# Patient Record
Sex: Male | Born: 1960 | Race: Black or African American | Hispanic: No | Marital: Single | Smoking: Current every day smoker
Health system: Southern US, Community
[De-identification: ages and names within clinical notes are randomized; demographics above are authoritative.]

## PROBLEM LIST (undated history)

## (undated) DIAGNOSIS — S0300XA Dislocation of jaw, unspecified side, initial encounter: Secondary | ICD-10-CM

## (undated) DIAGNOSIS — K219 Gastro-esophageal reflux disease without esophagitis: Secondary | ICD-10-CM

## (undated) DIAGNOSIS — E119 Type 2 diabetes mellitus without complications: Secondary | ICD-10-CM

## (undated) DIAGNOSIS — J449 Chronic obstructive pulmonary disease, unspecified: Secondary | ICD-10-CM

## (undated) DIAGNOSIS — I10 Essential (primary) hypertension: Secondary | ICD-10-CM

## (undated) DIAGNOSIS — K08109 Complete loss of teeth, unspecified cause, unspecified class: Secondary | ICD-10-CM

## (undated) DIAGNOSIS — G4730 Sleep apnea, unspecified: Secondary | ICD-10-CM

## (undated) DIAGNOSIS — R7303 Prediabetes: Secondary | ICD-10-CM

## (undated) HISTORY — PX: TOOTH EXTRACTION: SHX859

## (undated) HISTORY — PX: COLONOSCOPY: SHX174

---

## 2017-07-25 DIAGNOSIS — G4730 Sleep apnea, unspecified: Secondary | ICD-10-CM | POA: Insufficient documentation

## 2017-11-29 DIAGNOSIS — N401 Enlarged prostate with lower urinary tract symptoms: Secondary | ICD-10-CM | POA: Insufficient documentation

## 2018-05-14 DIAGNOSIS — F17210 Nicotine dependence, cigarettes, uncomplicated: Secondary | ICD-10-CM | POA: Insufficient documentation

## 2018-05-14 DIAGNOSIS — I10 Essential (primary) hypertension: Secondary | ICD-10-CM | POA: Insufficient documentation

## 2018-05-14 DIAGNOSIS — Z8711 Personal history of peptic ulcer disease: Secondary | ICD-10-CM | POA: Insufficient documentation

## 2020-02-29 ENCOUNTER — Ambulatory Visit
Admission: EM | Admit: 2020-02-29 | Discharge: 2020-02-29 | Disposition: A | Discharge status: Home / Self Care | Payer: BC Managed Care – PPO | Attending: Emergency Medicine | Admitting: Emergency Medicine

## 2020-02-29 ENCOUNTER — Encounter: Payer: Self-pay | Admitting: Gynecology

## 2020-02-29 ENCOUNTER — Other Ambulatory Visit: Payer: Self-pay

## 2020-02-29 DIAGNOSIS — I10 Essential (primary) hypertension: Secondary | ICD-10-CM

## 2020-02-29 DIAGNOSIS — Z760 Encounter for issue of repeat prescription: Secondary | ICD-10-CM | POA: Diagnosis not present

## 2020-02-29 HISTORY — DX: Prediabetes: R73.03

## 2020-02-29 HISTORY — DX: Essential (primary) hypertension: I10

## 2020-02-29 MED ORDER — AMLODIPINE BESY-BENAZEPRIL HCL 10-40 MG PO CAPS: 1.0000 | ORAL_CAPSULE | Freq: Every day | ORAL | 0 refills | Status: DC

## 2020-02-29 NOTE — ED Triage Notes (Signed)
Per patient recently relocated from [place] and has not found a primary care doctor as yet. Patient stated would like a refill on his RX Amlodipine-Benaz 10/40 mg for his blood pressure. Pt stated has not taken over 1 week.

## 2020-02-29 NOTE — ED Provider Notes (Signed)
HPI  SUBJECTIVE:  Cory Thompson is a 59 y.o. male who presents for medication refill. States that he ran out of his blood pressure medication 7 8 days ago. He has not measured his blood pressure recently at home. States that he misplaced his blood pressure cuff. He denies any symptoms. No headache, visual changes, arm or leg weakness, slurred speech, facial droop, syncope, seizures, chest pain, shortness of breath, palpitations, abdominal pain, anuria, hematuria, lower extremity edema, cocaine, decongestant use. He states that his blood pressure medicine controls his blood pressure well. He has a past medical history of hypertension and bradycardia diabetes. He does not need medication for his prediabetes. No history of MI, stroke, chronic kidney disease. PMD: None. States that he moved here 4 months ago.   Past Medical History:  Diagnosis Date  . Hypertension   . Pre-diabetes     Past Surgical History:  Procedure Laterality Date  . COLONOSCOPY    . TOOTH EXTRACTION      Family History  Problem Relation Age of Onset  . Diabetes Mother   . Hypertension Mother   . Hypertension Father     Social History   Tobacco Use  . Smoking status: Current Every Day Smoker    Packs/day: 1.00    Types: Cigarettes  . Smokeless tobacco: Never Used  Vaping Use  . Vaping Use: Never assessed  Substance Use Topics  . Alcohol use: Yes    Comment: occasion  . Drug use: Never    No current facility-administered medications for this encounter.  Current Outpatient Medications:  .  aspirin 81 MG EC tablet, Take by mouth., Disp: , Rfl:  .  atorvastatin (LIPITOR) 40 MG tablet, Take by mouth., Disp: , Rfl:  .  metFORMIN (GLUCOPHAGE) 500 MG tablet, Take by mouth., Disp: , Rfl:  .  tamsulosin (FLOMAX) 0.4 MG CAPS capsule, Take by mouth., Disp: , Rfl:  .  amLODipine-benazepril (LOTREL) 10-40 MG capsule, Take 1 capsule by mouth daily., Disp: 90 capsule, Rfl: 0  No Known Allergies   ROS  As noted  in HPI.   Physical Exam  BP (!) 163/94 (BP Location: Left Arm)   Pulse 71   Temp 98 F (36.7 C) (Oral)   Resp 16   Ht 5\' 11"  (1.803 m)   Wt 99.8 kg   SpO2 98%   BMI 30.68 kg/m   Constitutional: Well developed, well nourished, no acute distress Eyes:  EOMI, conjunctiva normal bilaterally HENT: Normocephalic, atraumatic,mucus membranes moist Respiratory: Normal inspiratory effort lungs clear bilaterally Cardiovascular: Normal rate, regular rhythm no murmurs, rubs, gallops GI: nondistended skin: No rash, skin intact Musculoskeletal: no deformities, calves symmetric, nontender, no edema Neurologic: Alert & oriented x 3, no focal neuro deficits Psychiatric: Speech and behavior appropriate   ED Course   Medications - No data to display  No orders of the defined types were placed in this encounter.   No results found for this or any previous visit (from the past 24 hour(s)). No results found.  ED Clinical Impression  1. Essential hypertension   2. Medication refill      ED Assessment/Plan  Hypertension/medication refill.  Blood pressure noted.  Patient is asymptomatic as noted in HPI.  Has not taken his blood pressure medicine in a little over a week.  Will refill the amlodipine/benazepril and give him 3 months worth.  Will provide primary care list for follow-up.    States that he does not need any Metformin.  Discussed  MDM,  treatment plan, and plan for follow-up with patient. patient agrees with plan.   Meds ordered this encounter  Medications  . amLODipine-benazepril (LOTREL) 10-40 MG capsule    Sig: Take 1 capsule by mouth daily.    Dispense:  90 capsule    Refill:  0    *This clinic note was created using Scientist, clinical (histocompatibility and immunogenetics). Therefore, there may be occasional mistakes despite careful proofreading.   ?    Domenick Gong, MD 03/02/20 [phone]

## 2020-02-29 NOTE — Discharge Instructions (Addendum)
Decrease your salt intake. diet and exercise will lower your blood pressure significantly. It is important to keep your blood pressure under good control, as having a elevated blood pressure for prolonged periods of time significantly increases your risk of stroke, heart attacks, kidney damage, eye damage, and other problems. Measure your blood pressure once a day, preferably at the same time every day. Keep a log of this and bring it to your next doctor's appointment.  Bring your blood pressure cuff as well.  Return here in 2 weeks for blood pressure recheck if you're unable to find a primary care physician by then. Return immediately to the ER if you start having chest pain, headache, problems seeing, problems talking, problems walking, if you feel like you're about to pass out, if you do pass out, if you have a seizure, or for any other concerns.  Here is a list of primary care providers who are taking new patients:  Dr. Elizabeth Sauer [address] [place] [address] [phone]  [facility] [address] [place], [place] [address] [phone]  [facility] [place] [address] [place] [address]  [phone]  [facility] [address] [place], [place] [address] [phone]  [facility] [address]  [phone] [place], [place] [address]   Go to www.goodrx.com to look up your medications. This will give you a list of where you can find your prescriptions at the most affordable prices. Or ask the pharmacist what the cash price is, or if they have any other discount programs available to help make your medication more affordable. This can be less expensive than what you would pay with insurance.

## 2020-06-06 ENCOUNTER — Encounter: Payer: Self-pay | Admitting: Emergency Medicine

## 2020-06-06 ENCOUNTER — Ambulatory Visit
Admission: EM | Admit: 2020-06-06 | Discharge: 2020-06-06 | Disposition: A | Discharge status: Home / Self Care | Payer: BC Managed Care – PPO | Attending: Emergency Medicine | Admitting: Emergency Medicine

## 2020-06-06 ENCOUNTER — Other Ambulatory Visit: Payer: Self-pay

## 2020-06-06 DIAGNOSIS — I10 Essential (primary) hypertension: Secondary | ICD-10-CM | POA: Insufficient documentation

## 2020-06-06 DIAGNOSIS — Z760 Encounter for issue of repeat prescription: Secondary | ICD-10-CM | POA: Insufficient documentation

## 2020-06-06 DIAGNOSIS — E119 Type 2 diabetes mellitus without complications: Secondary | ICD-10-CM | POA: Insufficient documentation

## 2020-06-06 HISTORY — DX: Type 2 diabetes mellitus without complications: E11.9

## 2020-06-06 LAB — GLUCOSE, CAPILLARY: Glucose-Capillary: 128 mg/dL — ABNORMAL HIGH (ref 70–99)

## 2020-06-06 MED ORDER — FREESTYLE SYSTEM KIT
1.0000 | PACK | Freq: Every day | 0 refills | Status: AC
Start: 2020-06-06 — End: ?

## 2020-06-06 MED ORDER — GLUCOSE BLOOD VI STRP: ORAL_STRIP | 2 refills | Status: AC

## 2020-06-06 MED ORDER — AMLODIPINE BESY-BENAZEPRIL HCL 10-40 MG PO CAPS: 1.0000 | ORAL_CAPSULE | Freq: Every day | ORAL | 0 refills | Status: DC

## 2020-06-06 NOTE — Discharge Instructions (Addendum)
500 mg of Metformin in the morning and 500 mg at night.  Keep a log of your blood sugar.  Measure your blood sugar before breakfast and 1 hour after meal at night.  Keep a log of this.  Bring the log with you to your primary care provider appointment on December 4. Continue keeping a log of your blood pressure.  Bring this with you to your appointment as well.  Decrease your salt intake. diet and exercise will lower your blood pressure significantly. It is important to keep your blood pressure under good control, as having a elevated blood pressure for prolonged periods of time significantly increases your risk of stroke, heart attacks, kidney damage, eye damage, and other problems. Measure your blood pressure once a day, preferably at the same time every day. Keep a log of this and bring it to your next doctor's appointment.  Bring your blood pressure cuff as well.  Return here in 2 weeks for blood pressure recheck if you're unable to find a primary care physician by then. Return immediately to the ER if you start having chest pain, headache, problems seeing, problems talking, problems walking, if you feel like you're about to pass out, if you do pass out, if you have a seizure, or for any other concerns.  Go to www.goodrx.com to look up your medications. This will give you a list of where you can find your prescriptions at the most affordable prices. Or ask the pharmacist what the cash price is, or if they have any other discount programs available to help make your medication more affordable. This can be less expensive than what you would pay with insurance.

## 2020-06-06 NOTE — ED Triage Notes (Signed)
Pt present to [facility] for a refill on his BP medication. He states he has an appt with his PCP on December 4th but he will run out before then.

## 2020-06-06 NOTE — ED Provider Notes (Signed)
HPI  SUBJECTIVE:  Cory Thompson is a 59 y.o. male who presents for a medication refill of his blood pressure medications.  He states that the amlodipine/benazepril 10/40 has been controlling his blood pressure well.  He has been measuring at home and it has been ranging in the 120s over 70s.  He states that he is about to run out of his medication and needs a refill.  He is establishing care with [place] [facility] on December 4.  States that he is taking his blood pressure medication daily, denies any side effects.  He has no complaints.  He is also requesting that we check his glucose.  He is prescribed Metformin 1000 mg twice daily, but has only been taking at 1000 mg once a day.  He denies unintentional weight loss, polyuria, polydipsia, weakness, fatigue.  He has refills of this already, and does not need a prescription.  He has a past medical history of hypertension, obstructive sleep apnea, diabetes.  No history of asthma, edema, COPD.  PMD: He is establishing care with [place].    Past Medical History:  Diagnosis Date  . Diabetes mellitus without complication ([place])   . Hypertension   . Pre-diabetes     Past Surgical History:  Procedure Laterality Date  . COLONOSCOPY    . TOOTH EXTRACTION      Family History  Problem Relation Age of Onset  . Diabetes Mother   . Hypertension Mother   . Hypertension Father     Social History   Tobacco Use  . Smoking status: Current Every Day Smoker    Packs/day: 1.00    Types: Cigarettes  . Smokeless tobacco: Never Used  Vaping Use  . Vaping Use: Never assessed  Substance Use Topics  . Alcohol use: Yes    Comment: occasion  . Drug use: Never    No current facility-administered medications for this encounter.  Current Outpatient Medications:  .  metFORMIN (GLUCOPHAGE) 500 MG tablet, Take by mouth., Disp: , Rfl:  .  amLODipine-benazepril (LOTREL) 10-40 MG capsule, Take 1 capsule by mouth daily., Disp: 30 capsule, Rfl:  0 .  aspirin 81 MG EC tablet, Take by mouth., Disp: , Rfl:  .  atorvastatin (LIPITOR) 40 MG tablet, Take by mouth., Disp: , Rfl:  .  glucose blood test strip, Check your sugar in the morning before you eat breakfast, and one hour after a meal., Disp: 100 each, Rfl: 2 .  glucose monitoring kit (FREESTYLE) monitoring kit, 1 each by Does not apply route daily. Check glucose once in the morning before breakfast and 1 hour after a meal, Disp: 1 each, Rfl: 0  No Known Allergies   ROS  As noted in HPI.   Physical Exam  BP 127/87 (BP Location: Right Arm)   Pulse 88   Temp 98 F (36.7 C) (Oral)   Resp 18   Ht 5' 11"  (1.803 m)   Wt 99.8 kg   SpO2 97%   BMI 30.69 kg/m   Constitutional: Well developed, well nourished, no acute distress Eyes:  EOMI, conjunctiva normal bilaterally HENT: Normocephalic, atraumatic,mucus membranes moist Respiratory: Normal inspiratory effort, lungs clear bilaterally. Cardiovascular: Normal rate regular rhythm no murmurs rubs or gallops GI: nondistended skin: No rash, skin intact Musculoskeletal: no deformities Neurologic: Alert & oriented x 3, no focal neuro deficits Psychiatric: Speech and behavior appropriate   ED Course   Medications - No data to display  Orders Placed This Encounter  Procedures  .  Glucose, capillary    Standing Status:   Standing    Number of Occurrences:   1  . CBG monitoring, ED    Standing Status:   Standing    Number of Occurrences:   1    Results for orders placed or performed during the hospital encounter of 06/06/20 (from the past 24 hour(s))  Glucose, capillary     Status: Abnormal   Collection Time: 06/06/20  8:59 AM  Result Value Ref Range   Glucose-Capillary 128 (H) 70 - 99 mg/dL   No results found.  ED Clinical Impression  1. Essential hypertension   2. Type 2 diabetes mellitus without complication, without long-term current use of insulin ([place])   3. Medication refill      ED Assessment/Plan   1.   Hypertension.  Patient doing well on this.  No side effects.  Blood pressure is within normal limits here and states that it has been running normal limits at home.  Will provide 1 month refill of amlodipine/benazepril 10/40 1 tablet daily.  2.  Diabetes.  Previous records reviewed.  Last A1c in 11/20 was 7.  Was on 1000 mg of Metformin twice a day, but he has been taking metformin 1000 mg once a day.  His fingerstick here is 128.  Will have him do 500 mg of Metformin in the morning, 500 mg of Metformin at night for now, have him keep a log of his blood sugar, prescribe glucometer with strips.  States he does not need a refill on his Metformin.  Patient is establishing primary care with [place] [facility] on December 4.  Discussed labs, MDM, treatment plan, and plan for follow-up with patient.  patient agrees with plan.   Meds ordered this encounter  Medications  . amLODipine-benazepril (LOTREL) 10-40 MG capsule    Sig: Take 1 capsule by mouth daily.    Dispense:  30 capsule    Refill:  0  . glucose monitoring kit (FREESTYLE) monitoring kit    Sig: 1 each by Does not apply route daily. Check glucose once in the morning before breakfast and 1 hour after a meal    Dispense:  1 each    Refill:  0  . glucose blood test strip    Sig: Check your sugar in the morning before you eat breakfast, and one hour after a meal.    Dispense:  100 each    Refill:  2    *This clinic note was created using Lobbyist. Therefore, there may be occasional mistakes despite careful proofreading.   ?    Melynda Ripple, MD 06/06/20 1006

## 2020-07-10 ENCOUNTER — Telehealth: Payer: Self-pay | Admitting: Emergency Medicine

## 2020-07-10 ENCOUNTER — Ambulatory Visit
Admission: EM | Admit: 2020-07-10 | Discharge: 2020-07-10 | Disposition: A | Discharge status: Home / Self Care | Payer: BC Managed Care – PPO | Attending: Sports Medicine | Admitting: Sports Medicine

## 2020-07-10 ENCOUNTER — Other Ambulatory Visit: Payer: Self-pay

## 2020-07-10 DIAGNOSIS — E1165 Type 2 diabetes mellitus with hyperglycemia: Secondary | ICD-10-CM | POA: Diagnosis not present

## 2020-07-10 DIAGNOSIS — Z760 Encounter for issue of repeat prescription: Secondary | ICD-10-CM

## 2020-07-10 DIAGNOSIS — I10 Essential (primary) hypertension: Secondary | ICD-10-CM | POA: Diagnosis not present

## 2020-07-10 LAB — COMPREHENSIVE METABOLIC PANEL
ALT: 24 U/L (ref 0–44)
AST: 54 U/L — ABNORMAL HIGH (ref 15–41)
Albumin: 4.3 g/dL (ref 3.5–5.0)
Alkaline Phosphatase: 94 U/L (ref 38–126)
Anion gap: 8 (ref 5–15)
BUN: 11 mg/dL (ref 6–20)
CO2: 25 mmol/L (ref 22–32)
Calcium: 9.4 mg/dL (ref 8.9–10.3)
Chloride: 100 mmol/L (ref 98–111)
Creatinine, Ser: 1.17 mg/dL (ref 0.61–1.24)
GFR, Estimated: >60 mL/min (ref >60)
Glucose, Bld: 126 mg/dL — ABNORMAL HIGH (ref 70–99)
Potassium: 4.2 mmol/L (ref 3.5–5.1)
Sodium: 133 mmol/L — ABNORMAL LOW (ref 135–145)
Total Bilirubin: 1 mg/dL (ref 0.3–1.2)
Total Protein: 8.4 g/dL — ABNORMAL HIGH (ref 6.5–8.1)

## 2020-07-10 LAB — GLUCOSE, CAPILLARY: Glucose-Capillary: 132 mg/dL — ABNORMAL HIGH (ref 70–99)

## 2020-07-10 MED ORDER — AMLODIPINE BESY-BENAZEPRIL HCL 10-40 MG PO CAPS: ORAL_CAPSULE | ORAL | 1 refills | Status: DC

## 2020-07-10 NOTE — ED Provider Notes (Signed)
[facility]    CSN: [id number] Arrival date & time: 07/10/20  1419      History   Chief Complaint Chief Complaint  Patient presents with  . Medication Refill    HPI Cory Thompson is a 59 y.o. male.   Cory Thompson is a 59 y.o. male who presents for medication refill. States has been seen here several times for medication refills for his hypertension. He has not measured his blood pressure recently at home. States that he misplaced his blood pressure cuff. He denies any symptoms. No headache, visual changes, arm or leg weakness, slurred speech, facial droop, syncope, seizures, chest pain, shortness of breath, palpitations, abdominal pain, anuria, hematuria, lower extremity edema, cocaine, decongestant use. He states that his blood pressure medicine controls his blood pressure well. He has a past medical history of hypertension and bradycardia and diabetes. He does not need medication for his prediabetes and has been obtaining these medications from a physician in [place] where he relocated from 8 months ago.  She he has not seen that doctor since he left [place]. No history of MI, stroke, chronic kidney disease. PMD: None. States that he moved here 8 months ago.  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  He reports that he has an appointment to establish care on August 17, 2020.  Of note, he has not had any labs drawn in over a year for his hypertension or his diabetes.     Past Medical History:  Diagnosis Date  . Diabetes mellitus without complication ([place])   . Hypertension   . Pre-diabetes     There are no problems to display for this patient.   Past Surgical History:  Procedure Laterality Date  . COLONOSCOPY    . TOOTH EXTRACTION         Home Medications    Prior to Admission medications   Medication Sig Start Date End Date Taking? Authorizing Provider  esomeprazole (NEXIUM) 40 MG capsule  TAKE 1 CAPSULE BY MOUTH EVERY MORNING BEFORE BREAKFAST 07/08/19  Yes [provider]  fluticasone (FLONASE) 50 MCG/ACT nasal spray Place into the nose. 05/14/18  Yes [provider]  amLODipine-benazepril (LOTREL) 10-40 MG capsule 1 tab po daily 07/10/20 08/10/20  Verda Cumins, MD  aspirin 81 MG EC tablet Take by mouth. 06/17/19   [provider]  atorvastatin (LIPITOR) 40 MG tablet Take by mouth. 06/17/19   [provider]  glucose blood test strip Check your sugar in the morning before you eat breakfast, and one hour after a meal. 06/06/20   Melynda Ripple, MD  glucose monitoring kit (FREESTYLE) monitoring kit 1 each by Does not apply route daily. Check glucose once in the morning before breakfast and 1 hour after a meal 06/06/20   Melynda Ripple, MD  metFORMIN (GLUCOPHAGE) 500 MG tablet Take by mouth. 08/02/19   [provider]    Family History Family History  Problem Relation Age of Onset  . Diabetes Mother   . Hypertension Mother   . Hypertension Father     Social History Social History   Tobacco Use  . Smoking status: Current Every Day Smoker    Packs/day: 1.00    Types: Cigarettes  . Smokeless tobacco: Never Used  Substance Use Topics  . Alcohol use: Yes    Comment: occasion  . Drug use: Never     Allergies   Patient has no  known allergies.   Review of Systems Review of Systems  Constitutional: Negative for activity change and appetite change.  Respiratory: Negative for apnea, cough and chest tightness.   Cardiovascular: Negative for chest pain.  Gastrointestinal: Negative for abdominal pain, nausea and vomiting.  Skin: Negative for color change, pallor, rash and wound.  Neurological: Negative for dizziness, tremors, seizures, syncope, speech difficulty, weakness and headaches.  All other systems reviewed and are negative.    Physical Exam Triage Vital Signs ED Triage Vitals  Enc Vitals Group     BP  07/10/20 1435 133/78     Pulse Rate 07/10/20 1435 93     Resp 07/10/20 1435 19     Temp 07/10/20 1435 98.6 F (37 C)     Temp Source 07/10/20 1435 Oral     SpO2 07/10/20 1435 97 %     Weight --      Height --      Head Circumference --      Peak Flow --      Pain Score 07/10/20 1433 0     Pain Loc --      Pain Edu? --      Excl. in [place]? --    No data found.  Updated Vital Signs BP 133/78 (BP Location: Left Arm)   Pulse 93   Temp 98.6 F (37 C) (Oral)   Resp 19   SpO2 97%   Visual Acuity Right Eye Distance:   Left Eye Distance:   Bilateral Distance:    Right Eye Near:   Left Eye Near:    Bilateral Near:     Physical Exam Vitals and nursing note reviewed.  Constitutional:      General: He is not in acute distress.    Appearance: Normal appearance. He is not ill-appearing or toxic-appearing.  HENT:     Head: Normocephalic and atraumatic.     Mouth/Throat:     Mouth: Mucous membranes are moist.     Pharynx: Oropharynx is clear.  Eyes:     Extraocular Movements: Extraocular movements intact.     Pupils: Pupils are equal, round, and reactive to light.  Cardiovascular:     Rate and Rhythm: Normal rate and regular rhythm.     Pulses: Normal pulses.     Heart sounds: Normal heart sounds. No murmur heard. No gallop.   Pulmonary:     Effort: Pulmonary effort is normal.     Breath sounds: Normal breath sounds.  Abdominal:     General: Bowel sounds are normal. There is no distension.     Palpations: Abdomen is soft.     Tenderness: There is no abdominal tenderness. There is no right CVA tenderness, left CVA tenderness, guarding or rebound.     Hernia: A hernia is present.     Comments: Abdomen is soft, obese, nontender.  Patient does have a midline hernia that is accentuated with sitting forward.  No distention, no rebound, no guarding.  No CVA tenderness.  Bowel sounds are normal.  Musculoskeletal:     Cervical back: Normal range of motion and neck supple.  Skin:     Capillary Refill: Capillary refill takes less than 2 seconds.  Neurological:     General: No focal deficit present.     Mental Status: He is alert and oriented to person, place, and time.      UC Treatments / Results  Labs (all labs ordered are listed, but only abnormal results are displayed) Labs Reviewed  COMPREHENSIVE METABOLIC PANEL - Abnormal; Notable for the following components:      Result Value   Sodium 133 (*)    Glucose, Bld 126 (*)    Total Protein 8.4 (*)    AST 54 (*)    All other components within normal limits  GLUCOSE, CAPILLARY - Abnormal; Notable for the following components:   Glucose-Capillary 132 (*)    All other components within normal limits  HEMOGLOBIN A1C  PROTEIN / CREATININE RATIO, URINE  CBG MONITORING, ED    EKG   Radiology No results found.  Procedures Procedures (including critical care time)  Medications Ordered in UC Medications - No data to display  Initial Impression / Assessment and Plan / UC Course  I have reviewed the triage vital signs and the nursing notes.  Pertinent labs & imaging results that were available during my care of the patient were reviewed by me and considered in my medical decision making (see chart for details).  Clinical Course as of 07/10/20 1914  Ludwig Clarks Jul 10, 2020  1616 CBG monitoring, ED [KB]    Clinical Course User Index [KB] Verda Cumins, MD    Findings MMM discussed detail with the patient.  Patient voiced verbal understanding was in agreement.  I went ahead and check labs.  Reviewed the labs that were back.  Her sodium was a little bit low as well as his AST being a little elevated.  His liver function may be due to fatty liver or alcohol use.  We did discuss keeping his alcohol content to a minimum.  Encouraged him to drink plenty of fluids and to supplement his diet with a little bit of salt.  Discussed the balance given the fact that he does have hypertension.  Creatinine was within normal  limits so I went ahead and renewed his blood pressure medication.  He has a follow-up with Dr. Ronnald Ramp to establish care for his diabetes and hypertension.  Encouraged him to go ahead and keep that appointment which is scheduled for January 24.  I did give him a refill on his blood pressure medicines which should take him through to the scheduled follow-up.  Return to the clinic if he has any problems.  This was discussed in detail. Final Clinical Impressions(s) / UC Diagnoses   Final diagnoses:  Primary hypertension  Medication refill  Type 2 diabetes mellitus with hyperglycemia, without long-term current use of insulin [facility])     Discharge Instructions     He has a follow-up with Dr. Ronnald Ramp to establish care for his diabetes and hypertension.  Encouraged him to go ahead and keep that appointment which is scheduled for January 24.  I did give him a refill on his blood pressure medicines which should take him through to the scheduled follow-up.  Return to the clinic if he has any problems.  This was discussed in detail.    ED Prescriptions    Medication Sig Dispense Auth. Provider   amLODipine-benazepril (LOTREL) 10-40 MG capsule 1 tab po daily 30 capsule Verda Cumins, MD     PDMP not reviewed this encounter.   Verda Cumins, MD 07/10/20 [phone]

## 2020-07-10 NOTE — Discharge Instructions (Addendum)
He has a follow-up with Dr. Yetta Barre to establish care for his diabetes and hypertension.  Encouraged him to go ahead and keep that appointment which is scheduled for January 24.  I did give him a refill on his blood pressure medicines which should take him through to the scheduled follow-up.  Return to the clinic if he has any problems.  This was discussed in detail.

## 2020-07-10 NOTE — ED Triage Notes (Signed)
Pt is here needing Amlopdipine-benazepril refilled, pt states he keeps missing his PCP appointments & so he just comes here to get Rx refills. Pt last had his BP med yesterday, he has none left.

## 2020-07-11 LAB — PROTEIN / CREATININE RATIO, URINE
Creatinine, Urine: 185 mg/dL
Protein Creatinine Ratio: 0.11 mg/mg{Cre} (ref 0.00–0.15)
Total Protein, Urine: 20 mg/dL

## 2020-07-11 LAB — HEMOGLOBIN A1C
Hgb A1c MFr Bld: 7.1 % — ABNORMAL HIGH (ref 4.8–5.6)
Mean Plasma Glucose: 157.07 mg/dL

## 2020-08-17 ENCOUNTER — Ambulatory Visit: Payer: BC Managed Care – PPO | Admitting: Family Medicine

## 2020-08-17 ENCOUNTER — Other Ambulatory Visit: Payer: Self-pay

## 2020-08-17 ENCOUNTER — Encounter: Payer: Self-pay | Admitting: Family Medicine

## 2020-08-17 VITALS — BP 148/84 | HR 84 | Ht 71.0 in | Wt 229.0 lb

## 2020-08-17 DIAGNOSIS — E782 Mixed hyperlipidemia: Secondary | ICD-10-CM | POA: Diagnosis not present

## 2020-08-17 DIAGNOSIS — G4733 Obstructive sleep apnea (adult) (pediatric): Secondary | ICD-10-CM

## 2020-08-17 DIAGNOSIS — R3911 Hesitancy of micturition: Secondary | ICD-10-CM

## 2020-08-17 DIAGNOSIS — J449 Chronic obstructive pulmonary disease, unspecified: Secondary | ICD-10-CM

## 2020-08-17 DIAGNOSIS — I10 Essential (primary) hypertension: Secondary | ICD-10-CM

## 2020-08-17 DIAGNOSIS — E119 Type 2 diabetes mellitus without complications: Secondary | ICD-10-CM

## 2020-08-17 DIAGNOSIS — R7401 Elevation of levels of liver transaminase levels: Secondary | ICD-10-CM

## 2020-08-17 DIAGNOSIS — Z7689 Persons encountering health services in other specified circumstances: Secondary | ICD-10-CM

## 2020-08-17 DIAGNOSIS — Z720 Tobacco use: Secondary | ICD-10-CM

## 2020-08-17 DIAGNOSIS — H9312 Tinnitus, left ear: Secondary | ICD-10-CM

## 2020-08-17 MED ORDER — AMLODIPINE BESY-BENAZEPRIL HCL 10-40 MG PO CAPS: ORAL_CAPSULE | ORAL | 0 refills | Status: DC

## 2020-08-17 MED ORDER — METFORMIN HCL 1000 MG PO TABS: 1000.0000 mg | ORAL_TABLET | Freq: Two times a day (BID) | ORAL | 0 refills | Status: DC

## 2020-08-17 MED ORDER — ATORVASTATIN CALCIUM 40 MG PO TABS: 40.0000 mg | ORAL_TABLET | Freq: Every day | ORAL | 0 refills | Status: DC

## 2020-08-17 NOTE — Patient Instructions (Signed)

## 2020-08-17 NOTE — Progress Notes (Signed)
Date:  08/17/2020   Name:  Cory Thompson   DOB:  [birth year]   MRN:  [id number]   Chief Complaint: Establish Care, Diabetes, and Flu Vaccine  Patient is a 60 year old male who presents for a establish care exam. The patient reports the following problems: DM/HTN/OSA/hyperlipidemaia/ bph. Health maintenance has been reviewed up to date.  Diabetes He presents for his follow-up diabetic visit. He has type 2 diabetes mellitus. His disease course has been stable. There are no hypoglycemic associated symptoms. Pertinent negatives for hypoglycemia include no dizziness, headaches or nervousness/anxiousness. There are no diabetic associated symptoms. Pertinent negatives for diabetes include no chest pain and no polydipsia. There are no hypoglycemic complications. There are no diabetic complications. Risk factors for coronary artery disease include dyslipidemia, diabetes mellitus, hypertension, tobacco exposure and obesity. Current diabetic treatment includes oral agent (monotherapy). He is following a generally unhealthy diet. Meal planning includes avoidance of concentrated sweets and carbohydrate counting. An ACE inhibitor/angiotensin II receptor blocker is being taken.  Hypertension This is a chronic problem. The current episode started more than 1 year ago. The problem is unchanged. The problem is uncontrolled. Pertinent negatives include no chest pain, headaches, neck pain, palpitations or shortness of breath.    Lab Results  Component Value Date   CREATININE 1.17 07/10/2020   BUN 11 07/10/2020   NA 133 (L) 07/10/2020   K 4.2 07/10/2020   CL 100 07/10/2020   CO2 25 07/10/2020   No results found for: CHOL, HDL, LDLCALC, LDLDIRECT, TRIG, CHOLHDL No results found for: TSH Lab Results  Component Value Date   HGBA1C 7.1 (H) 07/10/2020   No results found for: WBC, HGB, HCT, MCV, PLT Lab Results  Component Value Date   ALT 24 07/10/2020   AST 54 (H) 07/10/2020   ALKPHOS 94 07/10/2020    BILITOT 1.0 07/10/2020     Review of Systems  Constitutional: Negative for chills and fever.  HENT: Negative for drooling, ear discharge, ear pain and sore throat.   Respiratory: Negative for cough, shortness of breath and wheezing.   Cardiovascular: Negative for chest pain, palpitations and leg swelling.  Gastrointestinal: Negative for abdominal pain, blood in stool, constipation, diarrhea and nausea.  Endocrine: Negative for polydipsia.  Genitourinary: Negative for dysuria, frequency, hematuria and urgency.  Musculoskeletal: Negative for back pain, myalgias and neck pain.  Skin: Negative for rash.  Allergic/Immunologic: Negative for environmental allergies.  Neurological: Negative for dizziness and headaches.  Hematological: Does not bruise/bleed easily.  Psychiatric/Behavioral: Negative for suicidal ideas. The patient is not nervous/anxious.     There are no problems to display for this patient.   No Known Allergies  Past Surgical History:  Procedure Laterality Date  . COLONOSCOPY    . TOOTH EXTRACTION      Social History   Tobacco Use  . Smoking status: Current Every Day Smoker    Packs/day: 1.00    Types: Cigarettes  . Smokeless tobacco: Never Used  Substance Use Topics  . Alcohol use: Yes    Comment: occasion  . Drug use: Never     Medication list has been reviewed and updated.  Current Meds  Medication Sig  . amLODipine-benazepril (LOTREL) 10-40 MG capsule 1 tab po daily  . fluticasone (FLONASE) 50 MCG/ACT nasal spray Place into the nose.  [facility] Kitchen glucose blood test strip Check your sugar in the morning before you eat breakfast, and one hour after a meal.  . glucose monitoring kit (FREESTYLE) monitoring kit 1  each by Does not apply route daily. Check glucose once in the morning before breakfast and 1 hour after a meal  . metFORMIN (GLUCOPHAGE) 500 MG tablet Take 500 mg by mouth 2 (two) times daily with a meal.    PHQ 2/9 Scores 08/17/2020  PHQ - 2 Score 0   PHQ- 9 Score 2    GAD 7 : Generalized Anxiety Score 08/17/2020  Nervous, Anxious, on Edge 0  Control/stop worrying 0  Worry too much - different things 0  Trouble relaxing 0  Restless 0  Easily annoyed or irritable 0  Afraid - awful might happen 0  Total GAD 7 Score 0    BP Readings from Last 3 Encounters:  08/17/20 (!) 148/84  07/10/20 133/78  06/06/20 127/87    Physical Exam Vitals and nursing note reviewed.  Constitutional:      Appearance: He is well-developed and well-nourished. He is obese.  HENT:     Head: Normocephalic.     Right Ear: Tympanic membrane, ear canal and external ear normal.     Left Ear: Tympanic membrane, ear canal and external ear normal.     Nose: Nose normal.     Mouth/Throat:     Lips: Pink.     Mouth: Oropharynx is clear and moist. Mucous membranes are moist.  Eyes:     Extraocular Movements: EOM normal.     Conjunctiva/sclera: Conjunctivae normal.     Pupils: Pupils are equal, round, and reactive to light.  Neck:     Thyroid: No thyroid mass.  Cardiovascular:     Rate and Rhythm: Normal rate and regular rhythm.     Pulses: Normal pulses and intact distal pulses.     Heart sounds: Normal heart sounds, S1 normal and S2 normal. No murmur heard.  No systolic murmur is present.  No diastolic murmur is present. No gallop. No S3 or S4 sounds.   Pulmonary:     Effort: Pulmonary effort is normal.     Breath sounds: Normal breath sounds.  Abdominal:     General: Bowel sounds are normal.     Palpations: Abdomen is soft. There is no hepatomegaly or splenomegaly.  Genitourinary:    Penis: Normal.      Prostate: Normal.     Rectum: Normal.  Musculoskeletal:        General: Normal range of motion.     Cervical back: Full passive range of motion without pain, normal range of motion and neck supple.  Lymphadenopathy:     Cervical: No cervical adenopathy.  Skin:    General: Skin is warm and dry.  Neurological:     Mental Status: He is alert  and oriented to person, place, and time.     Deep Tendon Reflexes: Reflexes are normal and symmetric.  Psychiatric:        Mood and Affect: Mood and affect normal.        Behavior: Behavior normal.        Thought Content: Thought content normal.        Judgment: Judgment normal.     Wt Readings from Last 3 Encounters:  08/17/20 229 lb (103.9 kg)  06/06/20 220 lb 0.3 oz (99.8 kg)  02/29/20 220 lb (99.8 kg)    BP (!) 148/84   Pulse 84   Ht _0  (1.803 m)   Wt 229 lb (103.9 kg)   SpO2 96%   BMI 31.94 kg/m   Assessment and Plan:  1. Establishing care with  new doctor, encounter for Patient establishing care with new physician.  Patient has been going to urgent care for the last several months to refill his baseline meds which includes the following below.  2. Type 2 diabetes mellitus without complication, without long-term current use of insulin (HCC) Chronic.  Controlled.  Stable.  Patient was supposed to be taken a gram of Metformin twice a day however is only taking 500 mg this is been changed to 1 g twice a day and that he has had still uncontrolled A1c's on 500 mg in the past. - metFORMIN (GLUCOPHAGE) 1000 MG tablet; Take 1 tablet (1,000 mg total) by mouth 2 (two) times daily with a meal.  Dispense: 180 tablet; Refill: 0  3. Primary hypertension Chronic.  Controlled.  Stable.  Blood pressure today is 148/84.  We will refill his amlodipine benazepril 10-40 mg once a day.  And will recheck blood pressure in 6 weeks.  At which time we will do renal function panel. - amLODipine-benazepril (LOTREL) 10-40 MG capsule; 1 tab po daily  Dispense: 90 capsule; Refill: 0  4. Moderate mixed hyperlipidemia not requiring statin therapy Chronic.  Controlled.  Stable.  Patient has been on atorvastatin in the past 40 mg but has not been taking any most recently.  Given the he is diabetic is most likely that he needs to be on this and this has been encouraged and he is agreed to resume this at  which time we will see him in 6 weeks and recheck his lipid status. - atorvastatin (LIPITOR) 40 MG tablet; Take 1 tablet (40 mg total) by mouth daily.  Dispense: 90 tablet; Refill: 0  5. OSA (obstructive sleep apnea) Chronic.  Patient was diagnosed with severe obstructive sleep apnea but was supposed to have come back for what I gather might be a titration which he did not.  This was 3 years ago and will likely need another evaluation from the one that he had done in [place].  I am not certain if we can get the [facility] results of we will refer to ear nose and throat for referral to Dr. Farrel Conners for evaluation who he is seen in the clinic for tinnitus see below. - Ambulatory referral to ENT  6. Tinnitus of left ear Patient has tinnitus primarily of the left ear that he is consistently is noting an has become very bothersome.  I am not certain about the previous evaluation if anything can be done but we will refer for OSA and recheck of the tinnitus problem since it seems to be more unilateral to him. - Ambulatory referral to ENT  7. Hesitancy Chronic.  Consistent.  Stable.  Patient's had some hesitancy and PSA last was in the 2.5 range I believe we will on the next visit check a DRE and will likely check his PSA.  8. Elevated transaminase measurement Patient has had one of his transaminases elevated and this is likely secondary to hepatic steatosis.  For this reason patient has been resumed on his statin therapy.  9. Chronic obstructive pulmonary disease, unspecified COPD type ([place]) Patient is a pack-a-day smoker for 30 years and has continued to smoke a pack a day.  This is likely going to be an issue in the future.  10. Nicotine abuse Patient has been advised of the health risks of smoking and counseled concerning cessation of tobacco products. I spent over 3 minutes for discussion and to answer questions.

## 2020-09-02 ENCOUNTER — Telehealth: Payer: Self-pay | Admitting: *Deleted

## 2020-09-02 NOTE — Telephone Encounter (Signed)
Received referral for low dose lung cancer screening CT scan. Message left at phone number listed in EMR for patient to call me back to facilitate scheduling scan.  

## 2020-09-03 ENCOUNTER — Encounter: Payer: Self-pay | Admitting: *Deleted

## 2020-09-03 ENCOUNTER — Telehealth: Payer: Self-pay | Admitting: *Deleted

## 2020-09-03 DIAGNOSIS — F17200 Nicotine dependence, unspecified, uncomplicated: Secondary | ICD-10-CM

## 2020-09-03 DIAGNOSIS — Z87891 Personal history of nicotine dependence: Secondary | ICD-10-CM

## 2020-09-03 DIAGNOSIS — Z122 Encounter for screening for malignant neoplasm of respiratory organs: Secondary | ICD-10-CM

## 2020-09-03 NOTE — Telephone Encounter (Signed)
Received referral for initial lung cancer screening scan. Contacted patient and obtained smoking history,(current, 23.25 pack year) as well as answering questions related to screening process. Patient denies signs of lung cancer such as weight loss or hemoptysis. Patient denies comorbidity that would prevent curative treatment if lung cancer were found. Patient is scheduled for shared decision making visit and CT scan on (date TBD due to patient's work schedule).

## 2020-09-04 ENCOUNTER — Telehealth: Payer: BC Managed Care – PPO | Admitting: Oncology

## 2020-09-07 ENCOUNTER — Ambulatory Visit: Admission: RE | Admit: 2020-09-07 | Payer: BC Managed Care – PPO | Source: Ambulatory Visit

## 2020-09-25 ENCOUNTER — Inpatient Hospital Stay: Payer: BC Managed Care – PPO | Attending: Oncology | Admitting: Oncology

## 2020-09-25 DIAGNOSIS — Z87891 Personal history of nicotine dependence: Secondary | ICD-10-CM

## 2020-09-25 NOTE — Progress Notes (Signed)
Virtual Visit via Video Note  I connected with Mr. Rock on 09/25/20 at  1:30 PM EST by a video enabled telemedicine application and verified that I am speaking with the correct person using two identifiers.  Location: Patient: Home Provider: Clinic    I discussed the limitations of evaluation and management by telemedicine and the availability of in person appointments. The patient expressed understanding and agreed to proceed.  I discussed the assessment and treatment plan with the patient. The patient was provided an opportunity to ask questions and all were answered. The patient agreed with the plan and demonstrated an understanding of the instructions.   The patient was advised to call back or seek an in-person evaluation if the symptoms worsen or if the condition fails to improve as anticipated.   In accordance with CMS guidelines, patient has met eligibility criteria including age, absence of signs or symptoms of lung cancer.  Social History   Tobacco Use  . Smoking status: Current Every Day Smoker    Packs/day: 0.75    Years: 31.00    Pack years: 23.25    Types: Cigarettes  . Smokeless tobacco: Never Used  Substance Use Topics  . Alcohol use: Yes    Comment: occasion  . Drug use: Never      A shared decision-making session was conducted prior to the performance of CT scan. This includes one or more decision aids, includes benefits and harms of screening, follow-up diagnostic testing, over-diagnosis, false positive rate, and total radiation exposure.   Counseling on the importance of adherence to annual lung cancer LDCT screening, impact of co-morbidities, and ability or willingness to undergo diagnosis and treatment is imperative for compliance of the program.   Counseling on the importance of continued smoking cessation for former smokers; the importance of smoking cessation for current smokers, and information about tobacco cessation interventions have been given to  patient including [place] and [employer] quit [place] programs.   Written order for lung cancer screening with LDCT has been given to the patient and any and all questions have been answered to the best of my abilities.    Yearly follow up will be coordinated by Burgess Estelle, Thoracic Navigator.  I provided 15 minutes of face-to-face video visit time during this encounter, and > 50% was spent counseling as documented under my assessment & plan.   Jacquelin Hawking, NP

## 2020-09-28 ENCOUNTER — Ambulatory Visit
Admission: RE | Admit: 2020-09-28 | Discharge: 2020-09-28 | Disposition: A | Discharge status: Home / Self Care | Payer: BC Managed Care – PPO | Source: Ambulatory Visit | Attending: Oncology | Admitting: Oncology

## 2020-09-28 ENCOUNTER — Other Ambulatory Visit: Payer: Self-pay

## 2020-09-28 DIAGNOSIS — Z122 Encounter for screening for malignant neoplasm of respiratory organs: Secondary | ICD-10-CM | POA: Diagnosis not present

## 2020-09-28 DIAGNOSIS — F17200 Nicotine dependence, unspecified, uncomplicated: Secondary | ICD-10-CM

## 2020-09-28 DIAGNOSIS — Z87891 Personal history of nicotine dependence: Secondary | ICD-10-CM

## 2020-09-28 DIAGNOSIS — F17210 Nicotine dependence, cigarettes, uncomplicated: Secondary | ICD-10-CM | POA: Diagnosis not present

## 2020-10-01 ENCOUNTER — Telehealth: Payer: Self-pay | Admitting: *Deleted

## 2020-10-01 NOTE — Telephone Encounter (Signed)
Notified patient of LDCT lung cancer screening program results with recommendation for 12 month follow up imaging. Also notified of incidental findings noted below and is encouraged to discuss further with PCP who will receive a copy of this note and/or the CT report. Patient verbalizes understanding.   IMPRESSION: 1. Lung-RADS 2, benign appearance or behavior. Continue annual screening with low-dose chest CT without contrast in 12 months. 2. Ascending thoracic aortic aneurysm. Recommend annual imaging followup by CTA or MRA. This recommendation follows 2010 ACCF/AHA/AATS/ACR/ASA/SCA/SCAI/SIR/STS/SVM Guidelines for the Diagnosis and Management of Patients with Thoracic Aortic Disease. Circulation. 2010; 121: V703-E035. Aortic aneurysm NOS (ICD10-I71.9). 3. Borderline enlarged pulmonic trunk. 4.  Emphysema (ICD10-J43.9).

## 2020-11-18 ENCOUNTER — Other Ambulatory Visit: Payer: Self-pay | Admitting: Family Medicine

## 2020-11-18 DIAGNOSIS — E782 Mixed hyperlipidemia: Secondary | ICD-10-CM

## 2020-11-18 DIAGNOSIS — E119 Type 2 diabetes mellitus without complications: Secondary | ICD-10-CM

## 2020-11-18 NOTE — Telephone Encounter (Signed)
Call and see if he can come in for labs and office visit this afternoon- don't eat lunch so we can do labs

## 2020-11-18 NOTE — Telephone Encounter (Signed)
Requested medication (s) are due for refill today: Yes  Requested medication (s) are on the active medication list: Yes  Last refill:  08/17/20  Future visit scheduled: No  Notes to clinic:  Unable to refill per protocol due to failed labs, no updated results.      Requested Prescriptions  Pending Prescriptions Disp Refills   atorvastatin (LIPITOR) 40 MG tablet [Pharmacy Med Name: ATORVASTATIN 40 MG TABLET] 90 tablet 0    Sig: TAKE 1 TABLET BY MOUTH EVERY DAY      Cardiovascular:  Antilipid - Statins Failed - 11/18/2020  9:35 AM      Failed - Total Cholesterol in normal range and within 360 days    No results found for: CHOL, POCCHOL, CHOLTOT        Failed - LDL in normal range and within 360 days    No results found for: LDLCALC, LDLC, HIRISKLDL, POCLDL, LDLDIRECT, REALLDLC, TOTLDLC        Failed - HDL in normal range and within 360 days    No results found for: HDL, POCHDL        Failed - Triglycerides in normal range and within 360 days    No results found for: TRIG, POCTRIG        Passed - Patient is not pregnant      Passed - Valid encounter within last 12 months    Recent Outpatient Visits           3 months ago Establishing care with new doctor, encounter for   [facility] Juline Patch, MD                 Signed Prescriptions Disp Refills   metFORMIN (GLUCOPHAGE) 1000 MG tablet 180 tablet 1    Sig: TAKE 1 TABLET (1,000 MG TOTAL) BY MOUTH 2 (TWO) TIMES DAILY WITH A MEAL.      Endocrinology:  Diabetes - Biguanides Failed - 11/18/2020  9:35 AM      Failed - eGFR in normal range and within 360 days    GFR, Estimated  Date Value Ref Range Status  07/10/2020 >60 >60 mL/min Final    Comment:    (NOTE) Calculated using the CKD-EPI Creatinine Equation (2021)           Passed - Cr in normal range and within 360 days    Creatinine, Ser  Date Value Ref Range Status  07/10/2020 1.17 0.61 - 1.24 mg/dL Final   Creatinine, Urine  Date Value  Ref Range Status  07/10/2020 185 mg/dL Final          Passed - HBA1C is between 0 and 7.9 and within 180 days    Hgb A1c MFr Bld  Date Value Ref Range Status  07/10/2020 7.1 (H) 4.8 - 5.6 % Final    Comment:    (NOTE) Pre diabetes:          5.7%-6.4%  Diabetes:              >6.4%  Glycemic control for   <7.0% adults with diabetes           Passed - Valid encounter within last 6 months    Recent Outpatient Visits           3 months ago Establishing care with new doctor, encounter for   [facility] Juline Patch, MD

## 2020-11-23 ENCOUNTER — Ambulatory Visit: Payer: BC Managed Care – PPO | Admitting: Family Medicine

## 2020-12-07 ENCOUNTER — Encounter: Payer: Self-pay | Admitting: Family Medicine

## 2020-12-07 ENCOUNTER — Ambulatory Visit: Payer: BC Managed Care – PPO | Admitting: Family Medicine

## 2020-12-07 ENCOUNTER — Other Ambulatory Visit: Payer: Self-pay

## 2020-12-07 VITALS — BP 136/60 | HR 88 | Ht 71.0 in | Wt 231.0 lb

## 2020-12-07 DIAGNOSIS — E1165 Type 2 diabetes mellitus with hyperglycemia: Secondary | ICD-10-CM | POA: Diagnosis not present

## 2020-12-07 DIAGNOSIS — I10 Essential (primary) hypertension: Secondary | ICD-10-CM | POA: Diagnosis not present

## 2020-12-07 DIAGNOSIS — Z720 Tobacco use: Secondary | ICD-10-CM | POA: Diagnosis not present

## 2020-12-07 DIAGNOSIS — E782 Mixed hyperlipidemia: Secondary | ICD-10-CM

## 2020-12-07 DIAGNOSIS — E119 Type 2 diabetes mellitus without complications: Secondary | ICD-10-CM

## 2020-12-07 DIAGNOSIS — Z6832 Body mass index (BMI) 32.0-32.9, adult: Secondary | ICD-10-CM

## 2020-12-07 MED ORDER — METFORMIN HCL 1000 MG PO TABS: 1000.0000 mg | ORAL_TABLET | Freq: Two times a day (BID) | ORAL | 1 refills | Status: DC

## 2020-12-07 MED ORDER — AMLODIPINE BESY-BENAZEPRIL HCL 10-40 MG PO CAPS: ORAL_CAPSULE | ORAL | 1 refills | Status: DC

## 2020-12-07 MED ORDER — ATORVASTATIN CALCIUM 40 MG PO TABS: 40.0000 mg | ORAL_TABLET | Freq: Every day | ORAL | 1 refills | Status: DC

## 2020-12-07 NOTE — Progress Notes (Signed)
Date:  12/07/2020   Name:  Cory Thompson   DOB:  01-05-1961   MRN:  [id number]   Chief Complaint: Diabetes, Hypertension, and Hyperlipidemia  Diabetes He presents for his follow-up diabetic visit. He has type 2 diabetes mellitus. His disease course has been stable. There are no hypoglycemic associated symptoms. Pertinent negatives for hypoglycemia include no dizziness, headaches, nervousness/anxiousness or sweats. There are no diabetic associated symptoms. Pertinent negatives for diabetes include no blurred vision, no chest pain and no polydipsia. There are no hypoglycemic complications. Symptoms are stable. There are no diabetic complications. Pertinent negatives for diabetic complications include no CVA, PVD or retinopathy. Risk factors for coronary artery disease include dyslipidemia, male sex and hypertension. Current diabetic treatment includes oral agent (monotherapy). He is compliant with treatment most of the time. He is following a generally healthy diet. Meal planning includes carbohydrate counting and avoidance of concentrated sweets. He rarely participates in exercise. Home blood sugar record trend: not taking. An ACE inhibitor/angiotensin II receptor blocker is being taken.  Hypertension This is a chronic problem. The current episode started more than 1 year ago. The problem has been gradually improving since onset. The problem is controlled. Pertinent negatives include no anxiety, blurred vision, chest pain, headaches, malaise/fatigue, neck pain, orthopnea, palpitations, peripheral edema, PND, shortness of breath or sweats. Past treatments include calcium channel blockers and ACE inhibitors. The current treatment provides moderate improvement. Compliance problems include exercise.  There is no history of angina, kidney disease, CVA, heart failure, left ventricular hypertrophy, PVD or retinopathy. There is no history of chronic renal disease, a hypertension causing med or renovascular  disease.  Hyperlipidemia This is a chronic problem. The current episode started more than 1 year ago. The problem is controlled. Recent lipid tests were reviewed and are normal. Exacerbating diseases include diabetes. He has no history of chronic renal disease. Pertinent negatives include no chest pain, focal weakness, myalgias or shortness of breath. Current antihyperlipidemic treatment includes statins.    Lab Results  Component Value Date   CREATININE 1.17 07/10/2020   BUN 11 07/10/2020   NA 133 (L) 07/10/2020   K 4.2 07/10/2020   CL 100 07/10/2020   CO2 25 07/10/2020   No results found for: CHOL, HDL, LDLCALC, LDLDIRECT, TRIG, CHOLHDL No results found for: TSH Lab Results  Component Value Date   HGBA1C 7.1 (H) 07/10/2020   No results found for: WBC, HGB, HCT, MCV, PLT Lab Results  Component Value Date   ALT 24 07/10/2020   AST 54 (H) 07/10/2020   ALKPHOS 94 07/10/2020   BILITOT 1.0 07/10/2020     Review of Systems  Constitutional: Negative for chills, fever and malaise/fatigue.  HENT: Negative for drooling, ear discharge, ear pain and sore throat.   Eyes: Negative for blurred vision.  Respiratory: Negative for cough, shortness of breath and wheezing.   Cardiovascular: Negative for chest pain, palpitations, orthopnea, leg swelling and PND.  Gastrointestinal: Negative for abdominal pain, blood in stool, constipation, diarrhea and nausea.  Endocrine: Negative for polydipsia.  Genitourinary: Negative for dysuria, frequency, hematuria and urgency.  Musculoskeletal: Negative for back pain, myalgias and neck pain.  Skin: Negative for rash.  Allergic/Immunologic: Negative for environmental allergies.  Neurological: Negative for dizziness, focal weakness and headaches.  Hematological: Does not bruise/bleed easily.  Psychiatric/Behavioral: Negative for suicidal ideas. The patient is not nervous/anxious.     There are no problems to display for this patient.   No Known  Allergies  Past Surgical History:  Procedure Laterality Date  . COLONOSCOPY    . TOOTH EXTRACTION      Social History   Tobacco Use  . Smoking status: Current Every Day Smoker    Packs/day: 0.75    Years: 31.00    Pack years: 23.25    Types: Cigarettes  . Smokeless tobacco: Never Used  Substance Use Topics  . Alcohol use: Yes    Comment: occasion  . Drug use: Never     Medication list has been reviewed and updated.  Current Meds  Medication Sig  . amLODipine-benazepril (LOTREL) 10-40 MG capsule 1 tab po daily  . atorvastatin (LIPITOR) 40 MG tablet Take 1 tablet (40 mg total) by mouth daily.  Mar[facility] Kitchen glucose blood test strip Check your sugar in the morning before you eat breakfast, and one hour after a meal.  . glucose monitoring kit (FREESTYLE) monitoring kit 1 each by Does not apply route daily. Check glucose once in the morning before breakfast and 1 hour after a meal  . metFORMIN (GLUCOPHAGE) 1000 MG tablet TAKE 1 TABLET (1,000 MG TOTAL) BY MOUTH 2 (TWO) TIMES DAILY WITH A MEAL.  . [DISCONTINUED] aspirin 81 MG EC tablet Take by mouth.    PHQ 2/9 Scores 12/07/2020 08/17/2020  PHQ - 2 Score 0 0  PHQ- 9 Score 0 2    GAD 7 : Generalized Anxiety Score 12/07/2020 08/17/2020  Nervous, Anxious, on Edge 0 0  Control/stop worrying 0 0  Worry too much - different things 0 0  Trouble relaxing 0 0  Restless 0 0  Easily annoyed or irritable 0 0  Afraid - awful might happen 0 0  Total GAD 7 Score 0 0    BP Readings from Last 3 Encounters:  12/07/20 136/60  08/17/20 (!) 148/84  07/10/20 133/78    Physical Exam Vitals and nursing note reviewed.  HENT:     Head: Normocephalic.     Right Ear: External ear normal.     Left Ear: External ear normal.     Nose: Nose normal.  Eyes:     General: No scleral icterus.       Right eye: No discharge.        Left eye: No discharge.     Conjunctiva/sclera: Conjunctivae normal.     Pupils: Pupils are equal, round, and reactive to light.   Neck:     Thyroid: No thyromegaly.     Vascular: No JVD.     Trachea: No tracheal deviation.  Cardiovascular:     Rate and Rhythm: Normal rate and regular rhythm.     Heart sounds: Normal heart sounds. No murmur heard. No friction rub. No gallop.   Pulmonary:     Effort: No respiratory distress.     Breath sounds: Normal breath sounds. No wheezing or rales.  Abdominal:     General: Bowel sounds are normal.     Palpations: Abdomen is soft. There is no mass.     Tenderness: There is no abdominal tenderness. There is no guarding or rebound.  Musculoskeletal:        General: No tenderness. Normal range of motion.     Cervical back: Normal range of motion and neck supple.     Right foot: Normal range of motion. No deformity.     Left foot: Normal range of motion. No deformity.  Feet:     Right foot:     Protective Sensation: 10 sites tested. 10 sites sensed.     Skin  integrity: Skin integrity normal. No ulcer, blister, skin breakdown, erythema, warmth, callus or dry skin.     Toenail Condition: Right toenails are abnormally thick. Fungal disease present.    Left foot:     Protective Sensation: 10 sites tested. 10 sites sensed.     Skin integrity: Skin integrity normal. No ulcer, blister, skin breakdown, erythema, warmth, callus, dry skin or fissure.     Toenail Condition: Left toenails are normal.  Lymphadenopathy:     Cervical: No cervical adenopathy.  Skin:    General: Skin is warm.     Findings: No rash.  Neurological:     Mental Status: He is alert and oriented to person, place, and time.     Cranial Nerves: No cranial nerve deficit.     Deep Tendon Reflexes: Reflexes are normal and symmetric.     Wt Readings from Last 3 Encounters:  12/07/20 231 lb (104.8 kg)  09/28/20 235 lb (106.6 kg)  08/17/20 229 lb (103.9 kg)    BP 136/60   Pulse 88   Ht 5' 11" (1.803 m)   Wt 231 lb (104.8 kg)   BMI 32.22 kg/m   Assessment and Plan:  1. Primary hypertension Chronic.   Controlled.  Stable.  Blood pressure today is 136/60.  We will continue amlodipine benazepril 10-40 mg once a day.  Will recheck in 4 months.  We will check CMP today - amLODipine-benazepril (LOTREL) 10-40 MG capsule; 1 tab po daily  Dispense: 90 capsule; Refill: 1  2. Type 2 diabetes mellitus with hyperglycemia, without long-term current use of insulin (HCC) Chronic.  Controlled.  Stable.  Will check A1c CMP and microalbuminuria.  Will check foot exam today.  We will continue metformin 1 g twice a day. - HgB A1c - Comprehensive Metabolic Panel (CMET) - Microalbumin, urine  3. Moderate mixed hyperlipidemia not requiring statin therapy Chronic.  Controlled.  Stable.  Continue atorvastatin 40 mg once a day.  Will check lipid panel. - atorvastatin (LIPITOR) 40 MG tablet; Take 1 tablet (40 mg total) by mouth daily.  Dispense: 90 tablet; Refill: 1 - Lipid Panel With LDL/HDL Ratio  4. Nicotine abuse Patient has concerned information been given advised of the health risks of smoking and counseled concerning cessation of tobacco products. I spent over 3 minutes for discussion and to answer questions.  5. BMI 32.0-32.9,adult Information given on diabetic diet nutrition.  Health risks of being over weight were discussed and patient was counseled on weight loss options and exercise.

## 2020-12-07 NOTE — Patient Instructions (Signed)
American Journal of Respiratory and Critical Care Medicine, 202(2), e5-e31. https://doi.org/10.1164/rccm.202005-1982ST">  Health Risks of Smoking Smoking tobacco is very bad for your health. Tobacco smoke contains many toxic chemicals that can damage every part of your body. Secondhand smoke can be harmful to those around you. Tobacco or nicotine use can cause many long-term (chronic) diseases. Smoking is difficult to quit because a chemical in tobacco, called nicotine, causes addiction or dependence. When you smoke and inhale, nicotine is absorbed quickly into the bloodstream through your lungs. Both inhaled and non-inhaled nicotine may be addictive. How can quitting affect me? There are health benefits of quitting smoking. Some benefits happen right away and others take time. Benefits may include:  Blood flow, blood pressure, heart rate, and lung capacity may begin to improve. However, any lung damage that has already occurred cannot be repaired.  Temporary respiratory symptoms, such as nasal congestion and cough, may improve over time.  Your risk of heart disease, stroke, and cancer is reduced.  The overall quality of your health may improve.  You may save money, as you will not spend money on tobacco products and may spend less money on smoking-related health issues. What can increase my risk? Smoking harms nearly every organ in the body. People who smoke tobacco have a shorter life expectancy and an increased risk of many serious medical problems. These include:  More respiratory infections, such as colds and pneumonia.  Cancer.  Heart disease.  Stroke.  Chronic respiratory diseases.  Delayed wound healing and increased risk of complications during surgery.  Problems with reproduction, pregnancy, and childbirth, such as infertility, early (premature) births, stillbirths, and birth defects. Secondhand smoke exposure to children increases the risk of:  Sudden infant death  syndrome (SIDS).  Infections in the nose, throat, or airways (respiratory infections).  Chronic respiratory symptoms.   What actions can I take to quit? Smoking is an addiction that affects both your body and your mind, and long-time habits can be hard to change. Your health care provider can recommend:  Nicotine replacement products, such as patches, gum, and nasal sprays. Use these products only as directed. Do not replace cigarette smoking with electronic cigarettes, which are commonly called e-cigarettes. The safety of e-cigarettes is not known, and some may contain harmful chemicals.  Programs and community resources, which may include group support, education, or talk therapy.  Prescription medicines to help reduce cravings.  A combination of two or more quit methods, which will increase the success of quitting.   Where to find support Follow the recommendations from your health care provider about support groups and other assistance. You can also visit:  [place] [employer] Quitline Consortium: www.naquitline.org or call 1-800-QUIT-NOW.  [place] Department of Health and Human Services: www.smokefree.gov  American Lung Association: www.freedomfromsmoking.org  American Heart Association: www.heart.org Where to find more information  Centers for Disease Control and Prevention: www.cdc.gov  World Health Organization: www.who.int Summary  Smoking tobacco is very bad for your health. Tobacco smoke contains many toxic chemicals that can damage every part of the body.  Smoking is difficult to quit because a chemical in tobacco, called nicotine, causes addiction or dependence.  There are immediate and long-term health benefits of quitting smoking.  A combination of two or more quit methods increases the success of quitting. This information is not intended to replace advice given to you by your health care provider. Make sure you discuss any questions you have with your health care  provider. Document Revised: 08/26/2019 Document Reviewed: 08/26/2019   Elsevier Patient Education  2021 Elsevier Inc. Diabetes Mellitus and Nutrition, Adult When you have diabetes, or diabetes mellitus, it is very important to have healthy eating habits because your blood sugar (glucose) levels are greatly affected by what you eat and drink. Eating healthy foods in the right amounts, at about the same times every day, can help you:  Control your blood glucose.  Lower your risk of heart disease.  Improve your blood pressure.  Reach or maintain a healthy weight. What can affect my meal plan? Every person with diabetes is different, and each person has different needs for a meal plan. Your health care provider may recommend that you work with a dietitian to make a meal plan that is best for you. Your meal plan may vary depending on factors such as:  The calories you need.  The medicines you take.  Your weight.  Your blood glucose, blood pressure, and cholesterol levels.  Your activity level.  Other health conditions you have, such as heart or kidney disease. How do carbohydrates affect me? Carbohydrates, also called carbs, affect your blood glucose level more than any other type of food. Eating carbs naturally raises the amount of glucose in your blood. Carb counting is a method for keeping track of how many carbs you eat. Counting carbs is important to keep your blood glucose at a healthy level, especially if you use insulin or take certain oral diabetes medicines. It is important to know how many carbs you can safely have in each meal. This is different for every person. Your dietitian can help you calculate how many carbs you should have at each meal and for each snack. How does alcohol affect me? Alcohol can cause a sudden decrease in blood glucose (hypoglycemia), especially if you use insulin or take certain oral diabetes medicines. Hypoglycemia can be a life-threatening condition.  Symptoms of hypoglycemia, such as sleepiness, dizziness, and confusion, are similar to symptoms of having too much alcohol.  Do not drink alcohol if: ? Your health care provider tells you not to drink. ? You are pregnant, may be pregnant, or are planning to become pregnant.  If you drink alcohol: ? Do not drink on an empty stomach. ? Limit how much you use to:  0-1 drink a day for women.  0-2 drinks a day for men. ? Be aware of how much alcohol is in your drink. In the [place] one drink equals one 12 oz bottle of beer (355 mL), one 5 oz glass of wine (148 mL), or one 1 oz glass of hard liquor (44 mL). ? Keep yourself hydrated with water, diet soda, or unsweetened iced tea.  Keep in mind that regular soda, juice, and other mixers may contain a lot of sugar and must be counted as carbs. What are tips for following this plan? Reading food labels  Start by checking the serving size on the "Nutrition Facts" label of packaged foods and drinks. The amount of calories, carbs, fats, and other nutrients listed on the label is based on one serving of the item. Many items contain more than one serving per package.  Check the total grams (g) of carbs in one serving. You can calculate the number of servings of carbs in one serving by dividing the total carbs by 15. For example, if a food has 30 g of total carbs per serving, it would be equal to 2 servings of carbs.  Check the number of grams (g) of saturated fats and trans fats  in one serving. Choose foods that have a low amount or none of these fats.  Check the number of milligrams (mg) of salt (sodium) in one serving. Most people should limit total sodium intake to less than 2,300 mg per day.  Always check the nutrition information of foods labeled as "low-fat" or "nonfat." These foods may be higher in added sugar or refined carbs and should be avoided.  Talk to your dietitian to identify your daily goals for nutrients listed on the  label. Shopping  Avoid buying canned, pre-made, or processed foods. These foods tend to be high in fat, sodium, and added sugar.  Shop around the outside edge of the grocery store. This is where you will most often find fresh fruits and vegetables, bulk grains, fresh meats, and fresh dairy. Cooking  Use low-heat cooking methods, such as baking, instead of high-heat cooking methods like deep frying.  Cook using healthy oils, such as olive, canola, or sunflower oil.  Avoid cooking with butter, cream, or high-fat meats. Meal planning  Eat meals and snacks regularly, preferably at the same times every day. Avoid going long periods of time without eating.  Eat foods that are high in fiber, such as fresh fruits, vegetables, beans, and whole grains. Talk with your dietitian about how many servings of carbs you can eat at each meal.  Eat 4-6 oz (112-168 g) of lean protein each day, such as lean meat, chicken, fish, eggs, or tofu. One ounce (oz) of lean protein is equal to: ? 1 oz (28 g) of meat, chicken, or fish. ? 1 egg. ?  cup (62 g) of tofu.  Eat some foods each day that contain healthy fats, such as avocado, nuts, seeds, and fish.   What foods should I eat? Fruits Berries. Apples. Oranges. Peaches. Apricots. Plums. Grapes. Mango. Papaya. Pomegranate. Kiwi. Cherries. Vegetables Lettuce. Spinach. Leafy greens, including kale, chard, collard greens, and mustard greens. Beets. Cauliflower. Cabbage. Broccoli. Carrots. Green beans. Tomatoes. Peppers. Onions. Cucumbers. Brussels sprouts. Grains Whole grains, such as whole-wheat or whole-grain bread, crackers, tortillas, cereal, and pasta. Unsweetened oatmeal. Quinoa. Brown or wild rice. Meats and other proteins Seafood. Poultry without skin. Lean cuts of poultry and beef. Tofu. Nuts. Seeds. Dairy Low-fat or fat-free dairy products such as milk, yogurt, and cheese. The items listed above may not be a complete list of foods and beverages you  can eat. Contact a dietitian for more information. What foods should I avoid? Fruits Fruits canned with syrup. Vegetables Canned vegetables. Frozen vegetables with butter or cream sauce. Grains Refined white flour and flour products such as bread, pasta, snack foods, and cereals. Avoid all processed foods. Meats and other proteins Fatty cuts of meat. Poultry with skin. Breaded or fried meats. Processed meat. Avoid saturated fats. Dairy Full-fat yogurt, cheese, or milk. Beverages Sweetened drinks, such as soda or iced tea. The items listed above may not be a complete list of foods and beverages you should avoid. Contact a dietitian for more information. Questions to ask a health care provider  Do I need to meet with a diabetes educator?  Do I need to meet with a dietitian?  What number can I call if I have questions?  When are the best times to check my blood glucose? Where to find more information:  American Diabetes Association: diabetes.org  Academy of Nutrition and Dietetics: www.eatright.[employer] of Diabetes and Digestive and Kidney Diseases: CarFlippers.tn  Association of Diabetes Care and Education Specialists: www.diabeteseducator.org Summary  It is important to have healthy eating habits because your blood sugar (glucose) levels are greatly affected by what you eat and drink.  A healthy meal plan will help you control your blood glucose and maintain a healthy lifestyle.  Your health care provider may recommend that you work with a dietitian to make a meal plan that is best for you.  Keep in mind that carbohydrates (carbs) and alcohol have immediate effects on your blood glucose levels. It is important to count carbs and to use alcohol carefully. This information is not intended to replace advice given to you by your health care provider. Make sure you discuss any questions you have with your health care provider. Document Revised: 06/18/2019  Document Reviewed: 06/18/2019 Elsevier Patient Education  2021 [employer].

## 2020-12-08 LAB — COMPREHENSIVE METABOLIC PANEL WITH GFR
ALT: 30 IU/L (ref 0–44)
AST: 53 IU/L — ABNORMAL HIGH (ref 0–40)
Albumin/Globulin Ratio: 2 (ref 1.2–2.2)
Albumin: 5 g/dL — ABNORMAL HIGH (ref 3.8–4.9)
Alkaline Phosphatase: 141 IU/L — ABNORMAL HIGH (ref 44–121)
BUN/Creatinine Ratio: 11 (ref 9–20)
BUN: 12 mg/dL (ref 6–24)
Bilirubin Total: 0.5 mg/dL (ref 0.0–1.2)
CO2: 21 mmol/L (ref 20–29)
Calcium: 9.9 mg/dL (ref 8.7–10.2)
Chloride: 99 mmol/L (ref 96–106)
Creatinine, Ser: 1.11 mg/dL (ref 0.76–1.27)
Globulin, Total: 2.5 g/dL (ref 1.5–4.5)
Glucose: 139 mg/dL — ABNORMAL HIGH (ref 65–99)
Potassium: 4.6 mmol/L (ref 3.5–5.2)
Sodium: 138 mmol/L (ref 134–144)
Total Protein: 7.5 g/dL (ref 6.0–8.5)
eGFR: 77 mL/min/1.73

## 2020-12-08 LAB — LIPID PANEL WITH LDL/HDL RATIO
Cholesterol, Total: 143 mg/dL (ref 100–199)
HDL: 34 mg/dL — ABNORMAL LOW
LDL Chol Calc (NIH): 85 mg/dL (ref 0–99)
LDL/HDL Ratio: 2.5 ratio (ref 0.0–3.6)
Triglycerides: 133 mg/dL (ref 0–149)
VLDL Cholesterol Cal: 24 mg/dL (ref 5–40)

## 2020-12-08 LAB — HEMOGLOBIN A1C
Est. average glucose Bld gHb Est-mCnc: 174 mg/dL
Hgb A1c MFr Bld: 7.7 % — ABNORMAL HIGH (ref 4.8–5.6)

## 2020-12-08 LAB — MICROALBUMIN, URINE: Microalbumin, Urine: 130 ug/mL

## 2020-12-09 ENCOUNTER — Other Ambulatory Visit: Payer: Self-pay

## 2020-12-09 ENCOUNTER — Telehealth: Payer: Self-pay

## 2020-12-09 DIAGNOSIS — E1165 Type 2 diabetes mellitus with hyperglycemia: Secondary | ICD-10-CM

## 2020-12-09 NOTE — Progress Notes (Unsigned)
Ref put in for endo

## 2020-12-09 NOTE — Telephone Encounter (Signed)
Called pt with lab results as well as called "[employer]" for him to get a fax number. Pt expressed the need to have his labs faxed over to this company for his insurance. I was given the telephone number [phone]. The fax [phone] received confirmation that the labs went through.

## 2021-03-15 ENCOUNTER — Ambulatory Visit: Payer: BC Managed Care – PPO | Admitting: Family Medicine

## 2021-03-15 ENCOUNTER — Encounter: Payer: Self-pay | Admitting: Family Medicine

## 2021-03-15 ENCOUNTER — Other Ambulatory Visit: Payer: Self-pay

## 2021-03-15 VITALS — BP 130/62 | HR 84 | Ht 71.0 in | Wt 229.0 lb

## 2021-03-15 DIAGNOSIS — Z6832 Body mass index (BMI) 32.0-32.9, adult: Secondary | ICD-10-CM

## 2021-03-15 DIAGNOSIS — E1165 Type 2 diabetes mellitus with hyperglycemia: Secondary | ICD-10-CM

## 2021-03-15 DIAGNOSIS — I10 Essential (primary) hypertension: Secondary | ICD-10-CM

## 2021-03-15 DIAGNOSIS — Z720 Tobacco use: Secondary | ICD-10-CM

## 2021-03-15 DIAGNOSIS — F458 Other somatoform disorders: Secondary | ICD-10-CM

## 2021-03-15 NOTE — Progress Notes (Signed)
Date:  03/15/2021   Name:  Cory Thompson   DOB:  15-Nov-1960   MRN:  [id number]   Chief Complaint: Follow-up (Repeat A1C and bp for insurance forms)  Hypertension This is a chronic problem. The current episode started more than 1 year ago. The problem has been gradually improving since onset. The problem is controlled. Pertinent negatives include no chest pain, headaches, neck pain, palpitations or shortness of breath. Risk factors for coronary artery disease include dyslipidemia and obesity. Past treatments include ACE inhibitors and calcium channel blockers. The current treatment provides moderate improvement. There are no compliance problems.  There is no history of angina, CAD/MI, CVA, heart failure or left ventricular hypertrophy. There is no history of chronic renal disease, a hypertension causing med or renovascular disease.   Lab Results  Component Value Date   CREATININE 1.11 12/07/2020   BUN 12 12/07/2020   NA 138 12/07/2020   K 4.6 12/07/2020   CL 99 12/07/2020   CO2 21 12/07/2020   Lab Results  Component Value Date   CHOL 143 12/07/2020   HDL 34 (L) 12/07/2020   LDLCALC 85 12/07/2020   TRIG 133 12/07/2020   No results found for: TSH Lab Results  Component Value Date   HGBA1C 7.7 (H) 12/07/2020   No results found for: WBC, HGB, HCT, MCV, PLT Lab Results  Component Value Date   ALT 30 12/07/2020   AST 53 (H) 12/07/2020   ALKPHOS 141 (H) 12/07/2020   BILITOT 0.5 12/07/2020     Review of Systems  Constitutional:  Negative for chills and fever.  HENT:  Negative for drooling, ear discharge, ear pain and sore throat.   Respiratory:  Negative for cough, shortness of breath and wheezing.   Cardiovascular:  Negative for chest pain, palpitations and leg swelling.  Gastrointestinal:  Negative for abdominal pain, blood in stool, constipation, diarrhea and nausea.  Endocrine: Negative for polydipsia.  Genitourinary:  Negative for dysuria, frequency, hematuria and  urgency.  Musculoskeletal:  Negative for back pain, myalgias and neck pain.  Skin:  Negative for rash.  Allergic/Immunologic: Negative for environmental allergies.  Neurological:  Negative for dizziness and headaches.  Hematological:  Does not bruise/bleed easily.  Psychiatric/Behavioral:  Negative for suicidal ideas. The patient is not nervous/anxious.    Patient Active Problem List   Diagnosis Date Noted   Type 2 diabetes mellitus with hyperglycemia, without long-term current use of insulin ([place]) 12/07/2020    No Known Allergies  Past Surgical History:  Procedure Laterality Date   COLONOSCOPY     TOOTH EXTRACTION      Social History   Tobacco Use   Smoking status: Every Day    Packs/day: 0.75    Years: 31.00    Pack years: 23.25    Types: Cigarettes   Smokeless tobacco: Never  Substance Use Topics   Alcohol use: Yes    Comment: occasion   Drug use: Never     Medication list has been reviewed and updated.  Current Meds  Medication Sig   amLODipine-benazepril (LOTREL) 10-40 MG capsule 1 tab po daily   atorvastatin (LIPITOR) 40 MG tablet Take 1 tablet (40 mg total) by mouth daily.   fluticasone (FLONASE) 50 MCG/ACT nasal spray Place into the nose.   glucose blood test strip Check your sugar in the morning before you eat breakfast, and one hour after a meal.   glucose monitoring kit (FREESTYLE) monitoring kit 1 each by Does not apply route daily.  Check glucose once in the morning before breakfast and 1 hour after a meal   metFORMIN (GLUCOPHAGE) 1000 MG tablet Take 1 tablet (1,000 mg total) by mouth 2 (two) times daily with a meal.    PHQ 2/9 Scores 12/07/2020 08/17/2020  PHQ - 2 Score 0 0  PHQ- 9 Score 0 2    GAD 7 : Generalized Anxiety Score 12/07/2020 08/17/2020  Nervous, Anxious, on Edge 0 0  Control/stop worrying 0 0  Worry too much - different things 0 0  Trouble relaxing 0 0  Restless 0 0  Easily annoyed or irritable 0 0  Afraid - awful might happen 0 0   Total GAD 7 Score 0 0    BP Readings from Last 3 Encounters:  03/15/21 130/62  12/07/20 136/60  08/17/20 (!) 148/84    Physical Exam Vitals and nursing note reviewed.  HENT:     Head: Normocephalic.     Right Ear: Tympanic membrane, ear canal and external ear normal.     Left Ear: Tympanic membrane, ear canal and external ear normal.     Nose: Nose normal.  Eyes:     General: No scleral icterus.       Right eye: No discharge.        Left eye: No discharge.     Conjunctiva/sclera: Conjunctivae normal.     Pupils: Pupils are equal, round, and reactive to light.  Neck:     Thyroid: No thyromegaly.     Vascular: No JVD.     Trachea: No tracheal deviation.  Cardiovascular:     Rate and Rhythm: Normal rate and regular rhythm.     Heart sounds: Normal heart sounds, S1 normal and S2 normal. No murmur heard. No systolic murmur is present.  No diastolic murmur is present.    No friction rub. No gallop. No S3 or S4 sounds.  Pulmonary:     Effort: No respiratory distress.     Breath sounds: Normal breath sounds. No wheezing or rales.  Abdominal:     General: Bowel sounds are normal.     Palpations: Abdomen is soft. There is no mass.     Tenderness: There is no abdominal tenderness. There is no guarding or rebound.  Musculoskeletal:        General: No tenderness. Normal range of motion.     Cervical back: Normal range of motion and neck supple.  Lymphadenopathy:     Cervical: No cervical adenopathy.  Skin:    General: Skin is warm.     Findings: No rash.  Neurological:     Mental Status: He is alert and oriented to person, place, and time.     Cranial Nerves: No cranial nerve deficit.     Deep Tendon Reflexes: Reflexes are normal and symmetric.    Wt Readings from Last 3 Encounters:  03/15/21 229 lb (103.9 kg)  12/07/20 231 lb (104.8 kg)  09/28/20 235 lb (106.6 kg)    BP 130/62   Pulse 84   Ht 5' 11"  (1.803 m)   Wt 229 lb (103.9 kg)   BMI 31.94 kg/m   Assessment  and Plan:  1. Primary hypertension Chronic.  Controlled.  Stable.  Patient did take his medication today and blood pressure is 130/62.  We will continue amlodipine benazepril combination at current dosing and will recheck in 6 weeks  2. Type 2 diabetes mellitus with hyperglycemia, without long-term current use of insulin                                             -  HgB A1c patient has a history of diabetes with elevated A1c.  We will repeat A1c with anticipation of upcoming endocrinology appointment.  3. Nicotine abuse Patient has been advised of the health risks of smoking and counseled concerning cessation of tobacco products. I spent over 3 minutes for discussion and to answer questions.   4. BMI 32.0-32.9,adult Health risks of being over weight were discussed and patient was counseled on weight loss options and exercise.  Patient given diet for calorie counting.  5.  Aerophagia patient is complaining of aerophagia most likely due to the ill fitting dentures.  We have discussed means of limiting intake of air when eating.  We have also suggested use of simethicone as an over-the-counter preparation to control the symptoms as well

## 2021-03-15 NOTE — Patient Instructions (Signed)

## 2021-03-16 ENCOUNTER — Telehealth: Payer: Self-pay

## 2021-03-16 LAB — HEMOGLOBIN A1C
Est. average glucose Bld gHb Est-mCnc: 160 mg/dL
Hgb A1c MFr Bld: 7.2 % — ABNORMAL HIGH (ref 4.8–5.6)

## 2021-03-16 NOTE — Telephone Encounter (Signed)
Copied from CRM [id number]. Topic: General - Other >> Mar 16, 2021 11:43 AM Pawlus, Maxine Glenn A wrote: Reason for CRM: Pt stated he was calling back to advise that his daughter will come by today to sign a form, pt did not specify what kind of form when I asked FYI

## 2021-03-16 NOTE — Telephone Encounter (Signed)
Called pt as a result of him not signing his employee CPE form. 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 He does have a daughter that can come by and sign his name on the form per pt. Her name is Designer, multimedia. She is coming by today to sign form, so his form can be sent in before expiration date.

## 2021-04-09 ENCOUNTER — Ambulatory Visit: Payer: BC Managed Care – PPO | Admitting: Family Medicine

## 2021-04-12 ENCOUNTER — Ambulatory Visit: Payer: BC Managed Care – PPO | Admitting: Family Medicine

## 2021-05-10 ENCOUNTER — Ambulatory Visit: Payer: BC Managed Care – PPO | Admitting: Family Medicine

## 2021-05-10 DIAGNOSIS — E1169 Type 2 diabetes mellitus with other specified complication: Secondary | ICD-10-CM | POA: Diagnosis not present

## 2021-05-10 DIAGNOSIS — I152 Hypertension secondary to endocrine disorders: Secondary | ICD-10-CM | POA: Diagnosis not present

## 2021-05-10 DIAGNOSIS — E119 Type 2 diabetes mellitus without complications: Secondary | ICD-10-CM | POA: Diagnosis not present

## 2021-05-10 DIAGNOSIS — E1159 Type 2 diabetes mellitus with other circulatory complications: Secondary | ICD-10-CM | POA: Diagnosis not present

## 2021-05-17 ENCOUNTER — Ambulatory Visit: Payer: BC Managed Care – PPO | Admitting: Family Medicine

## 2021-06-06 ENCOUNTER — Other Ambulatory Visit: Payer: Self-pay | Admitting: Family Medicine

## 2021-06-06 DIAGNOSIS — E782 Mixed hyperlipidemia: Secondary | ICD-10-CM

## 2021-06-06 DIAGNOSIS — I10 Essential (primary) hypertension: Secondary | ICD-10-CM

## 2021-07-12 ENCOUNTER — Other Ambulatory Visit: Payer: Self-pay | Admitting: Family Medicine

## 2021-07-12 DIAGNOSIS — E782 Mixed hyperlipidemia: Secondary | ICD-10-CM

## 2021-07-12 DIAGNOSIS — I10 Essential (primary) hypertension: Secondary | ICD-10-CM

## 2021-07-12 NOTE — Telephone Encounter (Signed)
Requested medication (s) are due for refill today: yes  Requested medication (s) are on the active medication list: yes  Last refill:  06/07/21  Future visit scheduled: no  Notes to clinic:  passed protocol however Dr. Yetta Barre gave only one month supply last refill and no office appt scheduled.   Requested Prescriptions  Pending Prescriptions Disp Refills   atorvastatin (LIPITOR) 40 MG tablet [Pharmacy Med Name: ATORVASTATIN 40 MG TABLET] 30 tablet 0    Sig: TAKE 1 TABLET BY MOUTH EVERY DAY     Cardiovascular:  Antilipid - Statins Failed - 07/12/2021 11:03 AM      Failed - HDL in normal range and within 360 days    HDL  Date Value Ref Range Status  12/07/2020 34 (L) >39 mg/dL Final          Passed - Total Cholesterol in normal range and within 360 days    Cholesterol, Total  Date Value Ref Range Status  12/07/2020 143 100 - 199 mg/dL Final          Passed - LDL in normal range and within 360 days    LDL Chol Calc (NI[facility])  Date Value Ref Range Status  12/07/2020 85 0 - 99 mg/dL Final          Passed - Triglycerides in normal range and within 360 days    Triglycerides  Date Value Ref Range Status  12/07/2020 133 0 - 149 mg/dL Final          Passed - Patient is not pregnant      Passed - Valid encounter within last 12 months    Recent Outpatient Visits           3 months ago Primary hypertension   [facility] Duanne Limerick, MD   7 months ago Primary hypertension   [facility] Duanne Limerick, MD   10 months ago Establishing care with new doctor, encounter for   [facility] Duanne Limerick, MD               amLODipine-benazepril (LOTREL) 10-40 MG capsule [Pharmacy Med Name: AMLODIPINE-BENAZEPRIL 10-40 MG] 30 capsule 0    Sig: TAKE 1 CAPSULE BY MOUTH EVERY DAY     Cardiovascular: CCB + ACEI Combos Failed - 07/12/2021 11:03 AM      Failed - Cr in normal range and within 180 days    Creatinine, Ser  Date Value Ref Range  Status  12/07/2020 1.11 0.76 - 1.27 mg/dL Final   Creatinine, Urine  Date Value Ref Range Status  07/10/2020 185 mg/dL Final          Failed - K in normal range and within 180 days    Potassium  Date Value Ref Range Status  12/07/2020 4.6 3.5 - 5.2 mmol/L Final          Passed - Patient is not pregnant      Passed - Last BP in normal range    BP Readings from Last 1 Encounters:  03/15/21 130/62          Passed - Valid encounter within last 6 months    Recent Outpatient Visits           3 months ago Primary hypertension   [facility] Duanne Limerick, MD   7 months ago Primary hypertension   [facility] Duanne Limerick, MD   10 months ago Establishing care with new doctor, encounter for  [facility] Duanne Limerick, MD

## 2021-07-20 ENCOUNTER — Ambulatory Visit: Payer: Self-pay

## 2021-07-20 NOTE — Telephone Encounter (Signed)
°  Chief Complaint: congestion, wheezing, coughing Symptoms: congestion, wheezing, coughing, runny nose Frequency: 4 days Pertinent Negatives: Patient denies fever Disposition: [] ED /[] Urgent Care (no appt availability in office) / [x] Appointment(In office/virtual)/ []  [facility]/ [] Home Care/ [] Refused Recommended Disposition  Additional Notes: Pt was scheduled for appt tomorrow 07/21/21 at 1020 with Dr. . He is taking zyrtec OTC and at times Coricidin HBP    Reason for Disposition  [1] MILD difficulty breathing (e.g., minimal/no SOB at rest, SOB with walking, pulse <100) AND [2] NEW-onset or WORSE than normal  Answer Assessment - Initial Assessment Questions 1. RESPIRATORY STATUS: "Describe your breathing?" (e.g., wheezing, shortness of breath, unable to speak, severe coughing)      Wheezing, congestion, cough 2. ONSET: "When did this breathing problem begin?"      4 days 3. PATTERN "Does the difficult breathing come and go, or has it been constant since it started?"      Comes and go 4. SEVERITY: "How bad is your breathing?" (e.g., mild, moderate, severe)    - MILD: No SOB at rest, mild SOB with walking, speaks normally in sentences, can lie down, no retractions, pulse < 100.    - MODERATE: SOB at rest, SOB with minimal exertion and prefers to sit, cannot lie down flat, speaks in phrases, mild retractions, audible wheezing, pulse 100-120.    - SEVERE: Very SOB at rest, speaks in single words, struggling to breathe, sitting hunched forward, retractions, pulse > 120      At times with activity  9. OTHER SYMPTOMS: "Do you have any other symptoms? (e.g., dizziness, runny nose, cough, chest pain, fever)     Runny nose, cough, congestion  Protocols used: Breathing Difficulty-A-AH

## 2021-07-21 ENCOUNTER — Ambulatory Visit: Payer: BC Managed Care – PPO | Admitting: Family Medicine

## 2021-07-21 ENCOUNTER — Other Ambulatory Visit: Payer: Self-pay

## 2021-07-21 ENCOUNTER — Encounter: Payer: Self-pay | Admitting: Family Medicine

## 2021-07-21 VITALS — BP 130/80 | HR 88 | Ht 71.0 in | Wt 230.0 lb

## 2021-07-21 DIAGNOSIS — E782 Mixed hyperlipidemia: Secondary | ICD-10-CM | POA: Diagnosis not present

## 2021-07-21 DIAGNOSIS — J0100 Acute maxillary sinusitis, unspecified: Secondary | ICD-10-CM | POA: Diagnosis not present

## 2021-07-21 DIAGNOSIS — I10 Essential (primary) hypertension: Secondary | ICD-10-CM | POA: Diagnosis not present

## 2021-07-21 DIAGNOSIS — E1165 Type 2 diabetes mellitus with hyperglycemia: Secondary | ICD-10-CM | POA: Diagnosis not present

## 2021-07-21 MED ORDER — AMOXICILLIN 500 MG PO CAPS: 500.0000 mg | ORAL_CAPSULE | Freq: Three times a day (TID) | ORAL | 0 refills | Status: AC

## 2021-07-21 MED ORDER — AMLODIPINE BESY-BENAZEPRIL HCL 10-40 MG PO CAPS: ORAL_CAPSULE | ORAL | 1 refills | Status: DC

## 2021-07-21 MED ORDER — ATORVASTATIN CALCIUM 40 MG PO TABS: 40.0000 mg | ORAL_TABLET | Freq: Every day | ORAL | 1 refills | Status: DC

## 2021-07-21 NOTE — Progress Notes (Signed)
° ° °Date:  07/21/2021  ° °Name:  Cory Thompson   DOB:  05/08/1961   MRN:  [id number] ° ° °Chief Complaint: Hypertension, Hyperlipidemia, and Sinusitis (Congestion and cough- frothy) ° °Hypertension °This is a chronic problem. The current episode started more than 1 year ago. The problem has been gradually improving since onset. The problem is controlled. Pertinent negatives include no anxiety, blurred vision, chest pain, headaches, malaise/fatigue, neck pain, orthopnea, palpitations, peripheral edema, PND, shortness of breath or sweats. There are no associated agents to hypertension. Past treatments include ACE inhibitors and calcium channel blockers. The current treatment provides moderate improvement. There is no history of angina, kidney disease, CAD/MI, CVA, heart failure, left ventricular hypertrophy, PVD or retinopathy. There is no history of chronic renal disease, a hypertension causing med or renovascular disease.  °Hyperlipidemia °This is a chronic problem. The current episode started more than 1 year ago. The problem is controlled. Recent lipid tests were reviewed and are normal. He has no history of chronic renal disease. There are no known factors aggravating his hyperlipidemia. Pertinent negatives include no chest pain, focal sensory loss, focal weakness, leg pain, myalgias or shortness of breath. Current antihyperlipidemic treatment includes statins. The current treatment provides moderate improvement of lipids. There are no compliance problems.  Risk factors for coronary artery disease include hypertension, diabetes mellitus and dyslipidemia.  °Sinusitis °This is a new problem. The current episode started in the past 7 days. The problem has been gradually worsening since onset. There has been no fever. Associated symptoms include congestion, coughing and sinus pressure. Pertinent negatives include no chills, ear pain, headaches, neck pain, shortness of breath or sore throat. Treatments tried: otc  antihistamine. The treatment provided no relief.  ° °Lab Results  °Component Value Date  ° NA 138 12/07/2020  ° K 4.6 12/07/2020  ° CO2 21 12/07/2020  ° GLUCOSE 139 (H) 12/07/2020  ° BUN 12 12/07/2020  ° CREATININE 1.11 12/07/2020  ° CALCIUM 9.9 12/07/2020  ° EGFR 77 12/07/2020  ° GFRNONAA >60 07/10/2020  ° °Lab Results  °Component Value Date  ° CHOL 143 12/07/2020  ° HDL 34 (L) 12/07/2020  ° LDLCALC 85 12/07/2020  ° TRIG 133 12/07/2020  ° °No results found for: TSH °Lab Results  °Component Value Date  ° HGBA1C 7.2 (H) 03/15/2021  ° °No results found for: WBC, HGB, HCT, MCV, PLT °Lab Results  °Component Value Date  ° ALT 30 12/07/2020  ° AST 53 (H) 12/07/2020  ° ALKPHOS 141 (H) 12/07/2020  ° BILITOT 0.5 12/07/2020  ° °No results found for: 25OHVITD2, 25OHVITD3, VD25OH  ° °Review of Systems  °Constitutional:  Negative for chills, fever and malaise/fatigue.  °HENT:  Positive for congestion and sinus pressure. Negative for drooling, ear discharge, ear pain and sore throat.   °Eyes:  Negative for blurred vision.  °Respiratory:  Positive for cough. Negative for shortness of breath and wheezing.   °Cardiovascular:  Negative for chest pain, palpitations, orthopnea, leg swelling and PND.  °Gastrointestinal:  Negative for abdominal pain, blood in stool, constipation, diarrhea and nausea.  °Endocrine: Negative for polydipsia.  °Genitourinary:  Negative for dysuria, frequency, hematuria and urgency.  °Musculoskeletal:  Negative for back pain, myalgias and neck pain.  °Skin:  Negative for rash.  °Allergic/Immunologic: Negative for environmental allergies.  °Neurological:  Negative for dizziness, focal weakness and headaches.  °Hematological:  Does not bruise/bleed easily.  °Psychiatric/Behavioral:  Negative for suicidal ideas. The patient is not nervous/anxious.   ° °Patient Active Problem   List  ° Diagnosis Date Noted  ° Type 2 diabetes mellitus with hyperglycemia, without long-term current use of insulin ([facility]) 12/07/2020   ° ° °No Known Allergies ° °Past Surgical History:  °Procedure Laterality Date  ° COLONOSCOPY    ° TOOTH EXTRACTION    ° ° °Social History  ° °Tobacco Use  ° Smoking status: Every Day  °  Packs/day: 0.75  °  Years: 31.00  °  Pack years: 23.25  °  Types: Cigarettes  ° Smokeless tobacco: Never  °Substance Use Topics  ° Alcohol use: Yes  °  Comment: occasion  ° Drug use: Never  ° ° ° °Medication list has been reviewed and updated. ° °Current Meds  °Medication Sig  ° amLODipine-benazepril (LOTREL) 10-40 MG capsule TAKE 1 CAPSULE BY MOUTH EVERY DAY  ° atorvastatin (LIPITOR) 40 MG tablet TAKE 1 TABLET BY MOUTH EVERY DAY  ° fluticasone (FLONASE) 50 MCG/ACT nasal spray Place into the nose.  ° glucose blood test strip Check your sugar in the morning before you eat breakfast, and one hour after a meal.  ° glucose monitoring kit (FREESTYLE) monitoring kit 1 each by Does not apply route daily. Check glucose once in the morning before breakfast and 1 hour after a meal  ° metFORMIN (GLUCOPHAGE) 1000 MG tablet Take 1 tablet (1,000 mg total) by mouth 2 (two) times daily with a meal.  ° ° °PHQ 2/9 Scores 12/07/2020 08/17/2020  °PHQ - 2 Score 0 0  °PHQ- 9 Score 0 2  ° ° °GAD 7 : Generalized Anxiety Score 12/07/2020 08/17/2020  °Nervous, Anxious, on Edge 0 0  °Control/stop worrying 0 0  °Worry too much - different things 0 0  °Trouble relaxing 0 0  °Restless 0 0  °Easily annoyed or irritable 0 0  °Afraid - awful might happen 0 0  °Total GAD 7 Score 0 0  ° ° °BP Readings from Last 3 Encounters:  °07/21/21 130/80  °03/15/21 130/62  °12/07/20 136/60  ° ° °Physical Exam °Vitals and nursing note reviewed.  °HENT:  °   Head: Normocephalic.  °   Right Ear: Tympanic membrane, ear canal and external ear normal.  °   Left Ear: Tympanic membrane, ear canal and external ear normal.  °   Nose: Nose normal.  °Eyes:  °   General: No scleral icterus.    °   Right eye: No discharge.     °   Left eye: No discharge.  °   Conjunctiva/sclera: Conjunctivae  normal.  °   Pupils: Pupils are equal, round, and reactive to light.  °Neck:  °   Thyroid: No thyromegaly.  °   Vascular: No JVD.  °   Trachea: No tracheal deviation.  °Cardiovascular:  °   Rate and Rhythm: Normal rate and regular rhythm.  °   Heart sounds: Normal heart sounds. No murmur heard. °  No friction rub. No gallop.  °Pulmonary:  °   Effort: No respiratory distress.  °   Breath sounds: Normal breath sounds. No wheezing or rales.  °Abdominal:  °   General: Bowel sounds are normal.  °   Palpations: Abdomen is soft. There is no mass.  °   Tenderness: There is no abdominal tenderness. There is no guarding or rebound.  °Musculoskeletal:     °   General: No tenderness. Normal range of motion.  °   Cervical back: Normal range of motion and neck supple.  °Lymphadenopathy:  °     Cervical: No cervical adenopathy.  °Skin: °   General: Skin is warm.  °   Findings: No rash.  °Neurological:  °   Mental Status: He is alert and oriented to person, place, and time.  °   Cranial Nerves: No cranial nerve deficit.  °   Deep Tendon Reflexes: Reflexes are normal and symmetric.  ° ° °Wt Readings from Last 3 Encounters:  °07/21/21 230 lb (104.3 kg)  °03/15/21 229 lb (103.9 kg)  °12/07/20 231 lb (104.8 kg)  ° ° °BP 130/80    Pulse 88    Ht 5' 11" (1.803 m)    Wt 230 lb (104.3 kg)    BMI 32.08 kg/m²  ° °Assessment and Plan: ° °1. Primary hypertension °Chronic.  Controlled.  Stable.  Blood pressure 130/80.  Disease course improving.  Patient will continue amlodipine 10-40 mg once a day.  Will check renal function panel for GFR and electrolytes. °- amLODipine-benazepril (LOTREL) 10-40 MG capsule; TAKE 1 CAPSULE BY MOUTH EVERY DAY  Dispense: 90 capsule; Refill: 1 °- Renal Function Panel ° °2. Moderate mixed hyperlipidemia not requiring statin therapy °Chronic.  Controlled.  Stable.  Disease course is improving. °- atorvastatin (LIPITOR) 40 MG tablet; Take 1 tablet (40 mg total) by mouth daily.  Dispense: 90 tablet; Refill: 1 °- Lipid  Panel With LDL/HDL Ratio ° °3. Type 2 diabetes mellitus with hyperglycemia, without long-term current use of insulin ([facility]) °Chronic.  Controlled.  Stable.  Followed by endocrinology.  He is currently taking metformin 1 g twice a day. ° °4. Acute non-recurrent maxillary sinusitis °New onset.  Persistent.  Stable.  But symptomatic for several days still.  Likely with exam and history sinus infection we will treat with amoxicillin 500 mg 3 times a day for 10 days. °- amoxicillin (AMOXIL) 500 MG capsule; Take 1 capsule (500 mg total) by mouth 3 (three) times daily for 10 days.  Dispense: 30 capsule; Refill: 0  ° ° °

## 2021-07-22 LAB — RENAL FUNCTION PANEL
Albumin: 4.6 g/dL (ref 3.8–4.9)
BUN/Creatinine Ratio: 11 (ref 9–20)
BUN: 12 mg/dL (ref 6–24)
CO2: 23 mmol/L (ref 20–29)
Calcium: 9.9 mg/dL (ref 8.7–10.2)
Chloride: 101 mmol/L (ref 96–106)
Creatinine, Ser: 1.09 mg/dL (ref 0.76–1.27)
Glucose: 92 mg/dL (ref 70–99)
Phosphorus: 3.6 mg/dL (ref 2.8–4.1)
Potassium: 4.8 mmol/L (ref 3.5–5.2)
Sodium: 137 mmol/L (ref 134–144)
eGFR: 79 mL/min/{1.73_m2} (ref >59)

## 2021-07-22 LAB — LIPID PANEL WITH LDL/HDL RATIO
Cholesterol, Total: 182 mg/dL (ref 100–199)
HDL: 32 mg/dL — ABNORMAL LOW (ref >39)
LDL Chol Calc (NIH): 129 mg/dL — ABNORMAL HIGH (ref 0–99)
LDL/HDL Ratio: 4 ratio — ABNORMAL HIGH (ref 0.0–3.6)
Triglycerides: 112 mg/dL (ref 0–149)
VLDL Cholesterol Cal: 21 mg/dL (ref 5–40)

## 2021-08-09 ENCOUNTER — Other Ambulatory Visit: Payer: Self-pay

## 2021-08-09 DIAGNOSIS — F17210 Nicotine dependence, cigarettes, uncomplicated: Secondary | ICD-10-CM

## 2021-08-09 DIAGNOSIS — Z87891 Personal history of nicotine dependence: Secondary | ICD-10-CM

## 2021-09-30 ENCOUNTER — Ambulatory Visit
Admission: RE | Admit: 2021-09-30 | Discharge: 2021-09-30 | Disposition: A | Discharge status: Home / Self Care | Payer: BC Managed Care – PPO | Source: Ambulatory Visit | Attending: Acute Care | Admitting: Acute Care

## 2021-09-30 ENCOUNTER — Other Ambulatory Visit: Payer: Self-pay

## 2021-09-30 DIAGNOSIS — Z87891 Personal history of nicotine dependence: Secondary | ICD-10-CM | POA: Insufficient documentation

## 2021-09-30 DIAGNOSIS — F17210 Nicotine dependence, cigarettes, uncomplicated: Secondary | ICD-10-CM | POA: Diagnosis not present

## 2021-10-01 ENCOUNTER — Ambulatory Visit (INDEPENDENT_AMBULATORY_CARE_PROVIDER_SITE_OTHER): Payer: BC Managed Care – PPO | Admitting: Family Medicine

## 2021-10-01 ENCOUNTER — Encounter: Payer: Self-pay | Admitting: Family Medicine

## 2021-10-01 VITALS — BP 128/70 | HR 76 | Ht 71.0 in | Wt 216.0 lb

## 2021-10-01 DIAGNOSIS — Z0000 Encounter for general adult medical examination without abnormal findings: Secondary | ICD-10-CM | POA: Diagnosis not present

## 2021-10-01 DIAGNOSIS — R351 Nocturia: Secondary | ICD-10-CM

## 2021-10-01 DIAGNOSIS — Z6830 Body mass index (BMI) 30.0-30.9, adult: Secondary | ICD-10-CM

## 2021-10-01 NOTE — Progress Notes (Signed)
Date:  10/01/2021   Name:  Cory Thompson   DOB:  01/29/1961   MRN:  [id number]   Chief Complaint: Annual Exam  Patient is a 61 year old male who presents for a comprehensive physical exam. The patient reports the following problems: none. Health maintenance has been reviewed up to date.Cory Thompson is a 61 y.o. male who presents today for his Complete Annual Exam. He feels well. He reports no exercising . He reports he is sleeping fairly well.    Immunizations are reviewed and recommendations provided.   Age appropriate screening tests are discussed. Counseling given for risk factor reduction interventions.    Lab Results  Component Value Date   NA 137 07/21/2021   K 4.8 07/21/2021   CO2 23 07/21/2021   GLUCOSE 92 07/21/2021   BUN 12 07/21/2021   CREATININE 1.09 07/21/2021   CALCIUM 9.9 07/21/2021   EGFR 79 07/21/2021   GFRNONAA >60 07/10/2020   Lab Results  Component Value Date   CHOL 182 07/21/2021   HDL 32 (L) 07/21/2021   LDLCALC 129 (H) 07/21/2021   TRIG 112 07/21/2021   No results found for: TSH Lab Results  Component Value Date   HGBA1C 7.2 (H) 03/15/2021   No results found for: WBC, HGB, HCT, MCV, PLT Lab Results  Component Value Date   ALT 30 12/07/2020   AST 53 (H) 12/07/2020   ALKPHOS 141 (H) 12/07/2020   BILITOT 0.5 12/07/2020   No results found for: 25OHVITD2, 25OHVITD3, VD25OH   Review of Systems  Constitutional:  Negative for chills and fever.  HENT:  Negative for drooling, ear discharge, ear pain and sore throat.   Respiratory:  Negative for cough, shortness of breath and wheezing.   Cardiovascular:  Negative for chest pain, palpitations and leg swelling.  Gastrointestinal:  Negative for abdominal pain, blood in stool, constipation, diarrhea and nausea.  Endocrine: Negative for polydipsia.  Genitourinary:  Negative for dysuria, frequency, hematuria and urgency.  Musculoskeletal:  Negative for back pain, myalgias and neck pain.  Skin:   Negative for rash.  Allergic/Immunologic: Negative for environmental allergies.  Neurological:  Negative for dizziness and headaches.  Hematological:  Does not bruise/bleed easily.  Psychiatric/Behavioral:  Negative for suicidal ideas. The patient is not nervous/anxious.    Patient Active Problem List   Diagnosis Date Noted   Type 2 diabetes mellitus with hyperglycemia, without long-term current use of insulin ([place]) 12/07/2020    No Known Allergies  Past Surgical History:  Procedure Laterality Date   COLONOSCOPY     TOOTH EXTRACTION      Social History   Tobacco Use   Smoking status: Every Day    Packs/day: 0.75    Years: 31.00    Pack years: 23.25    Types: Cigarettes   Smokeless tobacco: Never  Substance Use Topics   Alcohol use: Yes    Comment: occasion   Drug use: Never     Medication list has been reviewed and updated.  Current Meds  Medication Sig   amLODipine-benazepril (LOTREL) 10-40 MG capsule TAKE 1 CAPSULE BY MOUTH EVERY DAY   atorvastatin (LIPITOR) 40 MG tablet Take 1 tablet (40 mg total) by mouth daily.   fluticasone (FLONASE) 50 MCG/ACT nasal spray Place into the nose.   glucose blood test strip Check your sugar in the morning before you eat breakfast, and one hour after a meal.   glucose monitoring kit (FREESTYLE) monitoring kit 1 each by Does not apply route  daily. Check glucose once in the morning before breakfast and 1 hour after a meal   SYNJARDY XR 12.11-998 MG TB24 Take 2 tablets by mouth every morning. Dr Honor Junes   [DISCONTINUED] metFORMIN (GLUCOPHAGE) 1000 MG tablet Take 1 tablet (1,000 mg total) by mouth 2 (two) times daily with a meal.    PHQ 2/9 Scores 10/01/2021 12/07/2020 08/17/2020  PHQ - 2 Score 0 0 0  PHQ- 9 Score 0 0 2    GAD 7 : Generalized Anxiety Score 10/01/2021 12/07/2020 08/17/2020  Nervous, Anxious, on Edge 0 0 0  Control/stop worrying 0 0 0  Worry too much - different things 0 0 0  Trouble relaxing 0 0 0  Restless 0 0 0   Easily annoyed or irritable 0 0 0  Afraid - awful might happen 0 0 0  Total GAD 7 Score 0 0 0  Anxiety Difficulty Not difficult at all - -    BP Readings from Last 3 Encounters:  10/01/21 128/70  07/21/21 130/80  03/15/21 130/62    Physical Exam Vitals and nursing note reviewed.  Constitutional:      Appearance: Normal appearance. He is well-groomed. He is obese.  HENT:     Head: Normocephalic.     Jaw: There is normal jaw occlusion.     Right Ear: Hearing, tympanic membrane, ear canal and external ear normal.     Left Ear: Hearing, tympanic membrane, ear canal and external ear normal.     Nose: Nose normal. No nasal tenderness, congestion or rhinorrhea.     Right Turbinates: Not enlarged or swollen.     Left Turbinates: Not enlarged or swollen.     Mouth/Throat:     Lips: Pink.     Mouth: Mucous membranes are moist.     Dentition: Normal dentition. No dental tenderness.     Tongue: No lesions.     Palate: No mass.     Pharynx: Oropharynx is clear. Uvula midline. No pharyngeal swelling or oropharyngeal exudate.     Tonsils: No tonsillar exudate.  Eyes:     General: Lids are normal. Vision grossly intact. Gaze aligned appropriately. No scleral icterus.       Right eye: No discharge.        Left eye: No discharge.     Extraocular Movements: Extraocular movements intact.     Conjunctiva/sclera: Conjunctivae normal.     Pupils: Pupils are equal, round, and reactive to light.     Funduscopic exam:    Right eye: Red reflex present.        Left eye: Red reflex present. Neck:     Thyroid: No thyroid mass, thyromegaly or thyroid tenderness.     Vascular: Normal carotid pulses. No carotid bruit, hepatojugular reflux or JVD.     Trachea: Trachea normal. No tracheal deviation.  Cardiovascular:     Rate and Rhythm: Normal rate and regular rhythm.     Chest Wall: PMI is not displaced.     Pulses: Normal pulses.     Heart sounds: Normal heart sounds, S1 normal and S2 normal. No  murmur heard. No systolic murmur is present.  No diastolic murmur is present.    No friction rub. No gallop. No S3 or S4 sounds.  Pulmonary:     Effort: Pulmonary effort is normal. No respiratory distress.     Breath sounds: Normal breath sounds. No decreased breath sounds, wheezing, rhonchi or rales.  Chest:  Breasts:    Breasts are symmetrical.  Right: Normal. No mass.     Left: Normal. No mass.  Abdominal:     General: Bowel sounds are normal.     Palpations: Abdomen is soft. There is no hepatomegaly, splenomegaly or mass.     Tenderness: There is no abdominal tenderness. There is no guarding or rebound.     Hernia: No hernia is present. There is no hernia in the umbilical area, ventral area, left inguinal area or right inguinal area.  Genitourinary:    Penis: Normal and uncircumcised.      Testes: Normal.        Right: Mass, tenderness or swelling not present.        Left: Mass, tenderness or swelling not present.     Epididymis:     Right: Normal.     Left: Normal.  Musculoskeletal:        General: No tenderness. Normal range of motion.     Cervical back: Full passive range of motion without pain, normal range of motion and neck supple.     Thoracic back: Normal.     Lumbar back: Normal.     Right lower leg: No edema.     Left lower leg: No edema.  Lymphadenopathy:     Head:     Right side of head: No submental, submandibular or tonsillar adenopathy.     Left side of head: No submental, submandibular or tonsillar adenopathy.     Cervical: No cervical adenopathy.     Right cervical: No superficial, deep or posterior cervical adenopathy.    Left cervical: No superficial, deep or posterior cervical adenopathy.     Upper Body:     Right upper body: No supraclavicular or axillary adenopathy.     Left upper body: No supraclavicular or axillary adenopathy.     Lower Body: No right inguinal adenopathy. No left inguinal adenopathy.  Skin:    General: Skin is warm.      Capillary Refill: Capillary refill takes less than 2 seconds.     Coloration: Skin is not ashen.     Findings: No rash.  Neurological:     Mental Status: He is alert and oriented to person, place, and time.     Cranial Nerves: Cranial nerves 2-12 are intact. No cranial nerve deficit.     Sensory: Sensation is intact.     Motor: Motor function is intact.     Deep Tendon Reflexes: Reflexes are normal and symmetric.     Reflex Scores:      Tricep reflexes are 2+ on the right side and 2+ on the left side.      Bicep reflexes are 2+ on the right side and 2+ on the left side.      Brachioradialis reflexes are 2+ on the right side and 2+ on the left side.      Patellar reflexes are 2+ on the right side and 2+ on the left side.      Achilles reflexes are 2+ on the right side and 2+ on the left side. Psychiatric:        Behavior: Behavior is cooperative.    Wt Readings from Last 3 Encounters:  10/01/21 216 lb (98 kg)  09/30/21 215 lb (97.5 kg)  07/21/21 230 lb (104.3 kg)    BP 128/70    Pulse 76    Ht _0  (1.803 m)    Wt 216 lb (98 kg)    BMI 30.13 kg/m   Assessment and Plan:  Patient's chart was reviewed for previous encounters most recent labs most recent imaging in [place].Cory Thompson is a 61 y.o. male who presents today for his Complete Annual Exam. He feels fairly well. He reports not exercising . He reports he is sleeping fairly well.    1. Annual physical exam Immunizations are reviewed and recommendations provided.   Age appropriate screening tests are discussed. Counseling given for risk factor reduction interventions.  No subjective/objective concerns noted on history present, history of past, review of systems, and physical exam.  Labs have been done previously by endocrinology but we will add a PSA.  2. Nocturia Patient has occasional nocturia.  DRE is normal with normal size shape and consistency as well as no nodularity of the prostate.  Guaiac is noted to be  negative.  We will obtain a PSA. - PSA  3. BMI 30.0-30.9,adult Health risks of being over weight were discussed and patient was counseled on weight loss options and exercise.

## 2021-10-02 LAB — PSA: Prostate Specific Ag, Serum: 2.7 ng/mL (ref 0.0–4.0)

## 2021-10-04 ENCOUNTER — Telehealth: Payer: Self-pay | Admitting: Family Medicine

## 2021-10-04 NOTE — Telephone Encounter (Signed)
Copied from CRM [id number]. Topic: General - Other ?>> Oct 04, 2021  8:52 AM McGill, Darlina Rumpf wrote: ?Reason for CRM: Pt requesting a cal back to discuss labs. ?

## 2021-10-05 ENCOUNTER — Telehealth: Payer: Self-pay | Admitting: Acute Care

## 2021-10-05 ENCOUNTER — Other Ambulatory Visit: Payer: Self-pay

## 2021-10-05 ENCOUNTER — Telehealth: Payer: Self-pay | Admitting: *Deleted

## 2021-10-05 DIAGNOSIS — Z87891 Personal history of nicotine dependence: Secondary | ICD-10-CM

## 2021-10-05 DIAGNOSIS — F17210 Nicotine dependence, cigarettes, uncomplicated: Secondary | ICD-10-CM

## 2021-10-05 NOTE — Telephone Encounter (Signed)
Order placed for Annual LDCT  °

## 2021-10-05 NOTE — Telephone Encounter (Signed)
Kandice Robinsons from [facility] called to make sure Dr. Yetta Barre is aware of new incidental finding on Cory Thompson's recent scan. A left renal lesion was found. She has already notified the pt, he is expecting to receive a call to schedule further testing. Maralyn Sago stated that [facility] recommends Abdominal MRI with and without Gadolinium soon.  ?

## 2021-10-05 NOTE — Telephone Encounter (Signed)
I have called the patient with the results of his low dose Ct Chest. From a lung cancer perspective his scan was read as a Lung RADS 2: nodules that are benign in appearance and behavior with a very low likelihood of becoming a clinically active cancer due to size or lack of growth. Recommendation per radiology is for a repeat LDCT in 12 months. We will call him and schedule his annual screening closer to 09/2022.  ? ?There was a concerning incidental finding of an incompletely imaged left renal lesion concerning for potential ?renal cell carcinoma. Further evaluation with abdominal MRI with and without IV gadolinium is strongly recommended in the near future to provide definitive characterization. I explained that I will call his PCP's office and have her follow up with imaging as she feels is clinically indicated. He is in agreement with this plan. I told him to anticipate getting a call to get this scheduled.  ? ?I spoke with Juliette Alcide, Dr. Yetta Barre' RN and explained the renal finding to her. She will discuss ordering follow up imaging  with Dr. Yetta Barre. I asked  her to let Dr. Yetta Barre know that she can call me for any additional information.  ? ?Angelique Blonder, annual screening scan due 09/2022. Dr. Yetta Barre office is aware of the results. Thanks so much ? ? ?

## 2021-10-18 ENCOUNTER — Other Ambulatory Visit: Payer: Self-pay

## 2021-10-18 ENCOUNTER — Encounter: Payer: Self-pay | Admitting: Family Medicine

## 2021-10-18 ENCOUNTER — Ambulatory Visit: Payer: BC Managed Care – PPO | Admitting: Family Medicine

## 2021-10-18 VITALS — BP 130/62 | HR 68 | Ht 71.0 in | Wt 214.0 lb

## 2021-10-18 DIAGNOSIS — R109 Unspecified abdominal pain: Secondary | ICD-10-CM | POA: Diagnosis not present

## 2021-10-18 DIAGNOSIS — R10A2 Flank pain, left side: Secondary | ICD-10-CM

## 2021-10-18 DIAGNOSIS — R93429 Abnormal radiologic findings on diagnostic imaging of unspecified kidney: Secondary | ICD-10-CM

## 2021-10-18 DIAGNOSIS — F17210 Nicotine dependence, cigarettes, uncomplicated: Secondary | ICD-10-CM | POA: Diagnosis not present

## 2021-10-18 NOTE — Progress Notes (Signed)
? ? ?Date:  10/18/2021  ? ?Name:  Cory Thompson   DOB:  January 08, 1961   MRN:  [id number] ? ? ?Chief Complaint: consultation results (CT lung screening showed lesion concerning for renal cell carcinoma) ? ?Patient is a 61 year old male who presents for a abnormal fining left kidney on chest ct exam. The patient reports the following problems: none. Health maintenance has been reviewed up to date. ?  ? ?Flank Pain ?This is a new problem. The current episode started more than 1 month ago (8 months). Episode frequency: intermitant/night and watching tv. The problem has been waxing and waning since onset. Pain location: left cva. The quality of the pain is described as aching. The pain is Worse during the night. The symptoms are aggravated by lying down. Pertinent negatives include no abdominal pain, chest pain, dysuria, fever or headaches.  ? ?Lab Results  ?Component Value Date  ? NA 137 07/21/2021  ? K 4.8 07/21/2021  ? CO2 23 07/21/2021  ? GLUCOSE 92 07/21/2021  ? BUN 12 07/21/2021  ? CREATININE 1.09 07/21/2021  ? CALCIUM 9.9 07/21/2021  ? EGFR 79 07/21/2021  ? GFRNONAA >60 07/10/2020  ? ?Lab Results  ?Component Value Date  ? CHOL 182 07/21/2021  ? HDL 32 (L) 07/21/2021  ? LDLCALC 129 (H) 07/21/2021  ? TRIG 112 07/21/2021  ? ?No results found for: TSH ?Lab Results  ?Component Value Date  ? HGBA1C 7.2 (H) 03/15/2021  ? ?No results found for: WBC, HGB, HCT, MCV, PLT ?Lab Results  ?Component Value Date  ? ALT 30 12/07/2020  ? AST 53 (H) 12/07/2020  ? ALKPHOS 141 (H) 12/07/2020  ? BILITOT 0.5 12/07/2020  ? ?No results found for: 25OHVITD2, [place], VD25OH  ? ?Review of Systems  ?Constitutional:  Negative for chills and fever.  ?HENT:  Negative for drooling, ear discharge, ear pain and sore throat.   ?Respiratory:  Negative for cough, shortness of breath and wheezing.   ?Cardiovascular:  Negative for chest pain, palpitations and leg swelling.  ?Gastrointestinal:  Negative for abdominal pain, blood in stool, constipation,  diarrhea and nausea.  ?Endocrine: Negative for polydipsia.  ?Genitourinary:  Positive for flank pain. Negative for dysuria, frequency, hematuria and urgency.  ?     Left flank pain when on left side sleeping  ?Musculoskeletal:  Negative for back pain, myalgias and neck pain.  ?Skin:  Negative for rash.  ?Allergic/Immunologic: Negative for environmental allergies.  ?Neurological:  Negative for dizziness and headaches.  ?Hematological:  Does not bruise/bleed easily.  ?Psychiatric/Behavioral:  Negative for suicidal ideas. The patient is not nervous/anxious.   ? ?Patient Active Problem List  ? Diagnosis Date Noted  ? Type 2 diabetes mellitus with hyperglycemia, without long-term current use of insulin ([place]) 12/07/2020  ? ? ?No Known Allergies ? ?Past Surgical History:  ?Procedure Laterality Date  ? COLONOSCOPY    ? TOOTH EXTRACTION    ? ? ?Social History  ? ?Tobacco Use  ? Smoking status: Every Day  ?  Packs/day: 0.75  ?  Years: 31.00  ?  Pack years: 23.25  ?  Types: Cigarettes  ? Smokeless tobacco: Never  ?Substance Use Topics  ? Alcohol use: Yes  ?  Comment: occasion  ? Drug use: Never  ? ? ? ?Medication list has been reviewed and updated. ? ?Current Meds  ?Medication Sig  ? amLODipine-benazepril (LOTREL) 10-40 MG capsule TAKE 1 CAPSULE BY MOUTH EVERY DAY  ? atorvastatin (LIPITOR) 40 MG tablet Take 1 tablet (40  mg total) by mouth daily.  ? fluticasone (FLONASE) 50 MCG/ACT nasal spray Place into the nose.  ? glucose blood test strip Check your sugar in the morning before you eat breakfast, and one hour after a meal.  ? glucose monitoring kit (FREESTYLE) monitoring kit 1 each by Does not apply route daily. Check glucose once in the morning before breakfast and 1 hour after a meal  ? SYNJARDY XR 12.11-998 MG TB24 Take 2 tablets by mouth every morning. Dr Honor Junes  ? ? ? ?  10/01/2021  ?  8:34 AM 12/07/2020  ?  7:58 AM 08/17/2020  ?  2:07 PM  ?PHQ 2/9 Scores  ?PHQ - 2 Score 0 0 0  ?PHQ- 9 Score 0 0 2  ? ? ? ?  10/01/2021  ?   8:35 AM 12/07/2020  ?  7:58 AM 08/17/2020  ?  2:08 PM  ?GAD 7 : Generalized Anxiety Score  ?Nervous, Anxious, on Edge 0 0 0  ?Control/stop worrying 0 0 0  ?Worry too much - different things 0 0 0  ?Trouble relaxing 0 0 0  ?Restless 0 0 0  ?Easily annoyed or irritable 0 0 0  ?Afraid - awful might happen 0 0 0  ?Total GAD 7 Score 0 0 0  ?Anxiety Difficulty Not difficult at all    ? ? ?BP Readings from Last 3 Encounters:  ?10/18/21 130/62  ?10/01/21 128/70  ?07/21/21 130/80  ? ? ?Physical Exam ?Vitals reviewed.  ?HENT:  ?   Head: Normocephalic.  ?   Right Ear: Tympanic membrane and external ear normal.  ?   Left Ear: Tympanic membrane and external ear normal.  ?   Nose: Nose normal. No congestion or rhinorrhea.  ?Eyes:  ?   General: No scleral icterus.    ?   Right eye: No discharge.     ?   Left eye: No discharge.  ?   Conjunctiva/sclera: Conjunctivae normal.  ?   Pupils: Pupils are equal, round, and reactive to light.  ?Neck:  ?   Thyroid: No thyromegaly.  ?   Vascular: No JVD.  ?   Trachea: No tracheal deviation.  ?Cardiovascular:  ?   Rate and Rhythm: Normal rate and regular rhythm.  ?   Heart sounds: Normal heart sounds. No murmur heard. ?  No friction rub. No gallop.  ?Pulmonary:  ?   Effort: No respiratory distress.  ?   Breath sounds: Normal breath sounds. No wheezing, rhonchi or rales.  ?Abdominal:  ?   General: Bowel sounds are normal.  ?   Palpations: Abdomen is soft. There is no mass.  ?   Tenderness: There is no abdominal tenderness. There is no right CVA tenderness, left CVA tenderness, guarding or rebound.  ?   Hernia: Hernia: abn ct scan.  ?Musculoskeletal:     ?   General: No tenderness. Normal range of motion.  ?   Cervical back: Normal range of motion and neck supple.  ?Lymphadenopathy:  ?   Cervical: No cervical adenopathy.  ?Skin: ?   General: Skin is warm.  ?   Findings: No rash.  ?Neurological:  ?   Mental Status: He is alert and oriented to person, place, and time.  ?   Cranial Nerves: No cranial  nerve deficit.  ?   Deep Tendon Reflexes: Reflexes are normal and symmetric.  ? ? ?Wt Readings from Last 3 Encounters:  ?10/18/21 214 lb (97.1 kg)  ?10/01/21 216 lb (98 kg)  ?  09/30/21 215 lb (97.5 kg)  ? ? ?BP 130/62   Pulse 68   Ht 5' 11"  (1.803 m)   Wt 214 lb (97.1 kg)   BMI 29.85 kg/m?  ? ?Assessment and Plan: ? ?1. Left flank pain ?New onset.  Persistent.  Episodic.  Noted over the last 8 months.  Patient has had discomfort particular when he lays down on the left side when watching TV or at night with sleep.  Recently patient had a CT of the lungs which noted a left renal lesion that is concerning for potential renal cell carcinoma on a incomplete image of the left renal aspect.  MRI is suggested but I will send to urology for evaluation for seen that they may need to have further evaluation that includes the bladder as well since patient continues to smoke. ?- Ambulatory referral to Urology ? ?2. Abnormal CT scan, kidney ?As noted above. ?- Ambulatory referral to Urology ? ?3. Continuous dependence on cigarette smoking ?Patient continues to smoke and we have discussed discontinuance.Patient has been advised of the health risks of smoking and counseled concerning cessation of tobacco products. I spent over 3 minutes for discussion and to answer questions.  ?- Ambulatory referral to Urology  ? ? ?

## 2021-10-18 NOTE — Patient Instructions (Signed)
Managing the Challenge of Quitting Smoking ?Quitting smoking is a physical and mental challenge. You will face cravings, withdrawal symptoms, and temptation. Before quitting, work with your health care provider to make a plan that can help you manage quitting. Preparation can help you quit and keep you from giving in. ?How to manage lifestyle changes ?Managing stress ?Stress can make you want to smoke, and wanting to smoke may cause stress. It is important to find ways to manage your stress. You might try some of the following: ?Practice relaxation techniques. ?Breathe slowly and deeply, in through your nose and out through your mouth. ?Listen to music. ?Soak in a bath or take a shower. ?Imagine a peaceful place or vacation. ?Get some support. ?Talk with family or friends about your stress. ?Join a support group. ?Talk with a counselor or therapist. ?Get some physical activity. ?Go for a walk, run, or bike ride. ?Play a favorite sport. ?Practice yoga. ? ?Medicines ?Talk with your health care provider about medicines that might help you deal with cravings and make quitting easier for you. ?Relationships ?Social situations can be difficult when you are quitting smoking. To manage this, you can: ?Avoid parties and other social situations where people might be smoking. ?Avoid alcohol. ?Leave right away if you have the urge to smoke. ?Explain to your family and friends that you are quitting smoking. Ask for support and let them know you might be a bit grumpy. ?Plan activities where smoking is not an option. ?General instructions ?Be aware that many people gain weight after they quit smoking. However, not everyone does. To keep from gaining weight, have a plan in place before you quit and stick to the plan after you quit. Your plan should include: ?Having healthy snacks. When you have a craving, it may help to: ?Eat popcorn, carrots, celery, or other cut vegetables. ?Chew sugar-free gum. ?Changing how you eat. ?Eat small  portion sizes at meals. ?Eat 4-6 small meals throughout the day instead of 1-2 large meals a day. ?Be mindful when you eat. Do not watch television or do other things that might distract you as you eat. ?Exercising regularly. ?Make time to exercise each day. If you do not have time for a long workout, do short bouts of exercise for 5-10 minutes several times a day. ?Do some form of strengthening exercise, such as weight lifting. ?Do some exercise that gets your heart beating and causes you to breathe deeply, such as walking fast, running, swimming, or biking. This is very important. ?Drinking plenty of water or other low-calorie or no-calorie drinks. Drink 6-8 glasses of water daily. ? ?How to recognize withdrawal symptoms ?Your body and mind may experience discomfort as you try to get used to not having nicotine in your system. These effects are called withdrawal symptoms. They may include: ?Feeling hungrier than normal. ?Having trouble concentrating. ?Feeling irritable or restless. ?Having trouble sleeping. ?Feeling depressed. ?Craving a cigarette. ?To manage withdrawal symptoms: ?Avoid places, people, and activities that trigger your cravings. ?Remember why you want to quit. ?Get plenty of sleep. ?Avoid coffee and other caffeinated drinks. These may worsen some of your symptoms. ?These symptoms may surprise you. But be assured that they are normal to have when quitting smoking. ?How to manage cravings ?Come up with a plan for how to deal with your cravings. The plan should include the following: ?A definition of the specific situation you want to deal with. ?An alternative action you will take. ?A clear idea for how this action   will help. ?The name of someone who might help you with this. ?Cravings usually last for 5-10 minutes. Consider taking the following actions to help you with your plan to deal with cravings: ?Keep your mouth busy. ?Chew sugar-free gum. ?Suck on hard candies or a straw. ?Brush your  teeth. ?Keep your hands and body busy. ?Change to a different activity right away. ?Squeeze or play with a ball. ?Do an activity or a hobby, such as making bead jewelry, practicing needlepoint, or working with wood. ?Mix up your normal routine. ?Take a short exercise break. Go for a quick walk or run up and down stairs. ?Focus on doing something kind or helpful for someone else. ?Call a friend or family member to talk during a craving. ?Join a support group. ?Contact a quitline. ?Where to find support ?To get help or find a support group: ?Call the National Cancer Institute's Smoking Quitline: 1-800-QUIT NOW (784-8669) ?Visit the website of the Substance Abuse and Mental Health Services Administration: www.samhsa.gov ?Text QUIT to SmokefreeTXT: 478848 ?Where to find more information ?Visit these websites to find more information on quitting smoking: ?National Cancer Institute: www.smokefree.gov ?American Lung Association: www.lung.org ?American Cancer Society: www.cancer.org ?Centers for Disease Control and Prevention: www.cdc.gov ?American Heart Association: www.heart.org ?Contact a health care provider if: ?You want to change your plan for quitting. ?The medicines you are taking are not helping. ?Your eating feels out of control or you cannot sleep. ?Get help right away if: ?You feel depressed or become very anxious. ?Summary ?Quitting smoking is a physical and mental challenge. You will face cravings, withdrawal symptoms, and temptation to smoke again. Preparation can help you as you go through these challenges. ?Try different techniques to manage stress, handle social situations, and prevent weight gain. ?You can deal with cravings by keeping your mouth busy (such as by chewing gum), keeping your hands and body busy, calling family or friends, or contacting a quitline for people who want to quit smoking. ?You can deal with withdrawal symptoms by avoiding places where people smoke, getting plenty of rest, and  avoiding drinks with caffeine. ?This information is not intended to replace advice given to you by your health care provider. Make sure you discuss any questions you have with your health care provider. ?Document Revised: 03/19/2021 Document Reviewed: 04/30/2019 ?Elsevier Patient Education ? 2022 Elsevier Inc. ? ?

## 2021-11-05 DIAGNOSIS — E1159 Type 2 diabetes mellitus with other circulatory complications: Secondary | ICD-10-CM | POA: Diagnosis not present

## 2021-11-05 DIAGNOSIS — I152 Hypertension secondary to endocrine disorders: Secondary | ICD-10-CM | POA: Diagnosis not present

## 2021-11-05 DIAGNOSIS — E785 Hyperlipidemia, unspecified: Secondary | ICD-10-CM | POA: Diagnosis not present

## 2021-11-05 DIAGNOSIS — E1169 Type 2 diabetes mellitus with other specified complication: Secondary | ICD-10-CM | POA: Diagnosis not present

## 2021-11-05 LAB — HM DIABETES FOOT EXAM: HM Diabetic Foot Exam: NORMAL

## 2021-11-05 LAB — HEMOGLOBIN A1C: Hemoglobin A1C: 6.5

## 2021-11-08 ENCOUNTER — Ambulatory Visit: Payer: BC Managed Care – PPO | Admitting: Urology

## 2021-11-08 ENCOUNTER — Encounter: Payer: Self-pay | Admitting: Urology

## 2022-01-14 ENCOUNTER — Other Ambulatory Visit: Payer: Self-pay | Admitting: Family Medicine

## 2022-01-14 ENCOUNTER — Telehealth: Payer: Self-pay | Admitting: Family Medicine

## 2022-01-14 DIAGNOSIS — I10 Essential (primary) hypertension: Secondary | ICD-10-CM

## 2022-01-17 ENCOUNTER — Other Ambulatory Visit: Payer: Self-pay | Admitting: Family Medicine

## 2022-01-17 ENCOUNTER — Telehealth: Payer: Self-pay | Admitting: Family Medicine

## 2022-01-17 DIAGNOSIS — I10 Essential (primary) hypertension: Secondary | ICD-10-CM

## 2022-01-19 ENCOUNTER — Ambulatory Visit: Payer: BC Managed Care – PPO | Admitting: Family Medicine

## 2022-01-19 ENCOUNTER — Other Ambulatory Visit: Payer: Self-pay | Admitting: Family Medicine

## 2022-01-19 DIAGNOSIS — I10 Essential (primary) hypertension: Secondary | ICD-10-CM

## 2022-01-19 NOTE — Telephone Encounter (Signed)
Pt will go pu rx today. Requested Prescriptions  Pending Prescriptions Disp Refills  . amLODipine-benazepril (LOTREL) 10-40 MG capsule [Pharmacy Med Name: AMLODIPINE-BENAZEPRIL 10-40 MG] 30 capsule     Sig: TAKE 1 CAPSULE BY MOUTH EVERY DAY     Cardiovascular: CCB + ACEI Combos Failed - 01/19/2022 11:02 AM      Failed - Cr in normal range and within 180 days    Creatinine, Ser  Date Value Ref Range Status  07/21/2021 1.09 0.76 - 1.27 mg/dL Final   Creatinine, Urine  Date Value Ref Range Status  07/10/2020 185 mg/dL Final         Failed - K in normal range and within 180 days    Potassium  Date Value Ref Range Status  07/21/2021 4.8 3.5 - 5.2 mmol/L Final         Failed - Na in normal range and within 180 days    Sodium  Date Value Ref Range Status  07/21/2021 137 134 - 144 mmol/L Final         Failed - eGFR is 30 or above and within 180 days    GFR, Estimated  Date Value Ref Range Status  07/10/2020 >60 >60 mL/min Final    Comment:    (NOTE) Calculated using the CKD-EPI Creatinine Equation (2021)    eGFR  Date Value Ref Range Status  07/21/2021 79 >59 mL/min/1.73 Final         Passed - Patient is not pregnant      Passed - Last BP in normal range    BP Readings from Last 1 Encounters:  10/18/21 130/62         Passed - Valid encounter within last 6 months    Recent Outpatient Visits          3 months ago Left flank pain   [facility] Juline Patch, MD   3 months ago Annual physical exam   [facility] Juline Patch, MD   6 months ago Primary hypertension   [facility] Juline Patch, MD   10 months ago Primary hypertension   [facility] Juline Patch, MD   1 year ago Primary hypertension   [place], Deanna C, MD      Future Appointments            In 1 week Juline Patch, MD [facility], [facility]

## 2022-01-19 NOTE — Telephone Encounter (Signed)
[facility] called and spoke to [place], [facility] about the refill(s) amlodipine-benzapril requested. Advised it was sent on 01/14/22 #90/1 refill(s). He states they received it and it is ready for pick up.   Called pt, advised him that medication is ready for pu. He states he had just called as well and will go pu today.

## 2022-01-24 ENCOUNTER — Ambulatory Visit: Payer: BC Managed Care – PPO | Admitting: Family Medicine

## 2022-01-31 ENCOUNTER — Ambulatory Visit: Payer: BC Managed Care – PPO | Admitting: Family Medicine

## 2022-01-31 ENCOUNTER — Encounter: Payer: Self-pay | Admitting: Family Medicine

## 2022-01-31 VITALS — BP 122/78 | HR 68 | Ht 71.0 in | Wt 216.0 lb

## 2022-01-31 DIAGNOSIS — E782 Mixed hyperlipidemia: Secondary | ICD-10-CM

## 2022-01-31 DIAGNOSIS — E1165 Type 2 diabetes mellitus with hyperglycemia: Secondary | ICD-10-CM

## 2022-01-31 DIAGNOSIS — I10 Essential (primary) hypertension: Secondary | ICD-10-CM

## 2022-01-31 LAB — HM DIABETES EYE EXAM

## 2022-01-31 MED ORDER — AMLODIPINE BESY-BENAZEPRIL HCL 10-40 MG PO CAPS: 1.0000 | ORAL_CAPSULE | Freq: Every day | ORAL | 0 refills | Status: DC

## 2022-01-31 MED ORDER — ATORVASTATIN CALCIUM 40 MG PO TABS: 40.0000 mg | ORAL_TABLET | Freq: Every day | ORAL | 1 refills | Status: DC

## 2022-01-31 NOTE — Progress Notes (Signed)
Date:  01/31/2022   Name:  Cory Thompson   DOB:  1960/12/25   MRN:  [id number]   Chief Complaint: Hypertension, Hyperlipidemia, and Diabetes  Hypertension This is a chronic problem. The current episode started more than 1 year ago. The problem has been gradually improving since onset. The problem is controlled. Pertinent negatives include no blurred vision, chest pain, headaches, orthopnea, palpitations, peripheral edema, PND or shortness of breath. There are no associated agents to hypertension. Past treatments include ACE inhibitors and calcium channel blockers. The current treatment provides moderate improvement. There are no compliance problems.  There is no history of chronic renal disease.  Hyperlipidemia This is a chronic problem. The current episode started more than 1 year ago. The problem is controlled. Recent lipid tests were reviewed and are normal. Exacerbating diseases include diabetes and obesity. He has no history of chronic renal disease, hypothyroidism, liver disease or nephrotic syndrome. Pertinent negatives include no chest pain, focal sensory loss, focal weakness, leg pain, myalgias or shortness of breath. Current antihyperlipidemic treatment includes statins. The current treatment provides moderate improvement of lipids. There are no compliance problems.  Risk factors for coronary artery disease include hypertension and dyslipidemia.  Diabetes He presents for his follow-up diabetic visit. His disease course has been stable. Pertinent negatives for hypoglycemia include no confusion, dizziness, headaches or nervousness/anxiousness. Pertinent negatives for diabetes include no blurred vision, no chest pain, no fatigue, no polydipsia, no polyphagia, no polyuria and no weight loss. Symptoms are stable.    Lab Results  Component Value Date   NA 137 07/21/2021   K 4.8 07/21/2021   CO2 23 07/21/2021   GLUCOSE 92 07/21/2021   BUN 12 07/21/2021   CREATININE 1.09 07/21/2021    CALCIUM 9.9 07/21/2021   EGFR 79 07/21/2021   GFRNONAA >60 07/10/2020   Lab Results  Component Value Date   CHOL 182 07/21/2021   HDL 32 (L) 07/21/2021   LDLCALC 129 (H) 07/21/2021   TRIG 112 07/21/2021   No results found for: "TSH" Lab Results  Component Value Date   HGBA1C 6.5 11/05/2021   No results found for: "WBC", "HGB", "HCT", "MCV", "PLT" Lab Results  Component Value Date   ALT 30 12/07/2020   AST 53 (H) 12/07/2020   ALKPHOS 141 (H) 12/07/2020   BILITOT 0.5 12/07/2020   No results found for: "25OHVITD2", "25OHVITD3", "VD25OH"   Review of Systems  Constitutional:  Negative for fatigue and weight loss.  Eyes:  Negative for blurred vision.  Respiratory:  Negative for shortness of breath.   Cardiovascular:  Negative for chest pain, palpitations, orthopnea and PND.  Gastrointestinal:  Negative for blood in stool.  Endocrine: Negative for polydipsia, polyphagia and polyuria.  Musculoskeletal:  Negative for myalgias.  Neurological:  Negative for dizziness, focal weakness and headaches.  Psychiatric/Behavioral:  Negative for confusion. The patient is not nervous/anxious.     Patient Active Problem List   Diagnosis Date Noted   Type 2 diabetes mellitus with hyperglycemia, without long-term current use of insulin ([place]) 12/07/2020    No Known Allergies  Past Surgical History:  Procedure Laterality Date   COLONOSCOPY     TOOTH EXTRACTION      Social History   Tobacco Use   Smoking status: Every Day    Packs/day: 0.75    Years: 31.00    Total pack years: 23.25    Types: Cigarettes   Smokeless tobacco: Never  Substance Use Topics   Alcohol use: Yes  Comment: occasion   Drug use: Never     Medication list has been reviewed and updated.  No outpatient medications have been marked as taking for the 01/31/22 encounter (Office Visit) with Juline Patch, MD.       01/31/2022    9:00 AM 10/01/2021    8:35 AM 12/07/2020    7:58 AM 08/17/2020    2:08 PM   GAD 7 : Generalized Anxiety Score  Nervous, Anxious, on Edge 0 0 0 0  Control/stop worrying 0 0 0 0  Worry too much - different things 1 0 0 0  Trouble relaxing 1 0 0 0  Restless 1 0 0 0  Easily annoyed or irritable 0 0 0 0  Afraid - awful might happen 0 0 0 0  Total GAD 7 Score 3 0 0 0  Anxiety Difficulty Not difficult at all Not difficult at all         01/31/2022    8:59 AM 10/01/2021    8:34 AM 12/07/2020    7:58 AM  Depression screen PHQ 2/9  Decreased Interest 1 0 0  Down, Depressed, Hopeless 0 0 0  PHQ - 2 Score 1 0 0  Altered sleeping 1 0 0  Tired, decreased energy 1 0 0  Change in appetite 0 0 0  Feeling bad or failure about yourself  0 0 0  Trouble concentrating 1 0 0  Moving slowly or fidgety/restless 0 0 0  Suicidal thoughts 0 0 0  PHQ-9 Score 4 0 0  Difficult doing work/chores Not difficult at all Not difficult at all     BP Readings from Last 3 Encounters:  01/31/22 122/78  10/18/21 130/62  10/01/21 128/70    Physical Exam Vitals and nursing note reviewed.  HENT:     Head: Normocephalic.     Right Ear: External ear normal.     Left Ear: External ear normal.     Nose: Nose normal.  Eyes:     General: No scleral icterus.       Right eye: No discharge.        Left eye: No discharge.     Conjunctiva/sclera: Conjunctivae normal.     Pupils: Pupils are equal, round, and reactive to light.  Neck:     Thyroid: No thyromegaly.     Vascular: No JVD.     Trachea: No tracheal deviation.  Cardiovascular:     Rate and Rhythm: Normal rate and regular rhythm.     Heart sounds: Normal heart sounds. No murmur heard.    No friction rub. No gallop.  Pulmonary:     Effort: No respiratory distress.     Breath sounds: Normal breath sounds. No wheezing or rales.  Abdominal:     General: Bowel sounds are normal.     Palpations: Abdomen is soft. There is no mass.     Tenderness: There is no abdominal tenderness. There is no guarding or rebound.  Musculoskeletal:         General: No tenderness. Normal range of motion.     Cervical back: Normal range of motion and neck supple.  Lymphadenopathy:     Cervical: No cervical adenopathy.  Skin:    General: Skin is warm.     Findings: No rash.     Wt Readings from Last 3 Encounters:  01/31/22 216 lb (98 kg)  10/18/21 214 lb (97.1 kg)  10/01/21 216 lb (98 kg)    BP 122/78  Pulse 68   Ht 5' 11"  (1.803 m)   Wt 216 lb (98 kg)   BMI 30.13 kg/m   Assessment and Plan:  1. Primary hypertension Chronic.  Controlled.  Stable.  Blood pressure 122/78.  Continue amlodipine benazepril 04-1239 mg once a day.  Will check CMP for electrolytes and GFR. - amLODipine-benazepril (LOTREL) 10-40 MG capsule; Take 1 capsule by mouth daily.  Dispense: 90 capsule; Refill: 0 - Comprehensive Metabolic Panel (CMET)  2. Moderate mixed hyperlipidemia not requiring statin therapy Chronic.  Controlled.  Stable.  Continue atorvastatin 40 mg once a day.  We will check lipid panel. - atorvastatin (LIPITOR) 40 MG tablet; Take 1 tablet (40 mg total) by mouth daily.  Dispense: 90 tablet; Refill: 1 - Lipid Panel With LDL/HDL Ratio  3. Type 2 diabetes mellitus with hyperglycemia, without long-term current use of insulin (HCC) Chronic.  Controlled.  Stable.  Continue with dietary control in the meantime we will also continue Synjardy per endocrinology.  We will check microalbuminuria and CMP as well as refer to ophthalmology for retina exam - Comprehensive Metabolic Panel (CMET) - Microalbumin, urine - Ambulatory referral to Ophthalmology

## 2022-02-01 LAB — COMPREHENSIVE METABOLIC PANEL
ALT: 19 IU/L (ref 0–44)
AST: 37 IU/L (ref 0–40)
Albumin/Globulin Ratio: 1.4 (ref 1.2–2.2)
Albumin: 4.6 g/dL (ref 3.8–4.9)
Alkaline Phosphatase: 128 IU/L — ABNORMAL HIGH (ref 44–121)
BUN/Creatinine Ratio: 11 (ref 9–20)
BUN: 13 mg/dL (ref 6–24)
Bilirubin Total: 0.4 mg/dL (ref 0.0–1.2)
CO2: 22 mmol/L (ref 20–29)
Calcium: 10.2 mg/dL (ref 8.7–10.2)
Chloride: 101 mmol/L (ref 96–106)
Creatinine, Ser: 1.14 mg/dL (ref 0.76–1.27)
Globulin, Total: 3.3 g/dL (ref 1.5–4.5)
Glucose: 98 mg/dL (ref 70–99)
Potassium: 4.5 mmol/L (ref 3.5–5.2)
Sodium: 138 mmol/L (ref 134–144)
Total Protein: 7.9 g/dL (ref 6.0–8.5)
eGFR: 74 mL/min/{1.73_m2} (ref >59)

## 2022-02-01 LAB — LIPID PANEL WITH LDL/HDL RATIO
Cholesterol, Total: 164 mg/dL (ref 100–199)
HDL: 36 mg/dL — ABNORMAL LOW (ref >39)
LDL Chol Calc (NIH): 106 mg/dL — ABNORMAL HIGH (ref 0–99)
LDL/HDL Ratio: 2.9 ratio (ref 0.0–3.6)
Triglycerides: 119 mg/dL (ref 0–149)
VLDL Cholesterol Cal: 22 mg/dL (ref 5–40)

## 2022-02-01 LAB — MICROALBUMIN, URINE: Microalbumin, Urine: 15.8 ug/mL

## 2022-02-02 ENCOUNTER — Telehealth: Payer: Self-pay | Admitting: Family Medicine

## 2022-02-02 NOTE — Telephone Encounter (Signed)
Referral Request - Has patient seen PCP for this complaint? yes *If NO, is insurance requiring patient see PCP for this issue before PCP can refer them? Referral for which specialty: opthamology Preferred provider/office: [facility]/carrie from [facility] in [place] wanted to confirm if referral should be for them or [facility] as patient requested? She says she can fax it to he [facility] location if its supposed to go there. Please call back to confirm Reason for referral: opthamology

## 2022-02-14 ENCOUNTER — Telehealth: Payer: Self-pay

## 2022-02-14 ENCOUNTER — Telehealth: Payer: Self-pay | Admitting: Family Medicine

## 2022-02-14 NOTE — Telephone Encounter (Signed)
Pt called in wanting last labs faxed to [facility] at [phone]- I faxed them

## 2022-02-14 NOTE — Telephone Encounter (Signed)
Copied from CRM [id number]. Topic: General - Other >> Feb 14, 2022  9:44 AM Lyman Speller wrote: Reason for CRM: needs his last Urine culture and lab results faxed to [facility] for his insurance premium  Fax# [phone]/ they need the date and results / please advise

## 2022-02-14 NOTE — Telephone Encounter (Signed)
Copied from CRM #[id number]. Topic: General - Other >> Feb 14, 2022  9:44 AM Cory Thompson wrote: Reason for CRM: needs his last Urine culture and lab results faxed to [facility] for his insurance premium  Fax# [phone]/ they need the date and results / please advise 

## 2022-04-04 ENCOUNTER — Ambulatory Visit: Payer: BC Managed Care – PPO | Admitting: Family Medicine

## 2022-04-04 ENCOUNTER — Encounter: Payer: Self-pay | Admitting: Family Medicine

## 2022-04-04 VITALS — BP 120/80 | HR 76 | Ht 71.0 in | Wt 214.0 lb

## 2022-04-04 DIAGNOSIS — Z0000 Encounter for general adult medical examination without abnormal findings: Secondary | ICD-10-CM

## 2022-04-04 DIAGNOSIS — Z23 Encounter for immunization: Secondary | ICD-10-CM

## 2022-04-04 NOTE — Progress Notes (Signed)
Date:  04/04/2022   Name:  Cory Thompson   DOB:  07/20/1961   MRN:  [id number]   Chief Complaint: wellness visit and Flu Vaccine  Patient is a 61 year old male who presents for a wellness physical exam. The patient reports the following problems: none. Health maintenance has been reviewed up to date.      Lab Results  Component Value Date   NA 138 01/31/2022   K 4.5 01/31/2022   CO2 22 01/31/2022   GLUCOSE 98 01/31/2022   BUN 13 01/31/2022   CREATININE 1.14 01/31/2022   CALCIUM 10.2 01/31/2022   EGFR 74 01/31/2022   GFRNONAA >60 07/10/2020   Lab Results  Component Value Date   CHOL 164 01/31/2022   HDL 36 (L) 01/31/2022   LDLCALC 106 (H) 01/31/2022   TRIG 119 01/31/2022   No results found for: "TSH" Lab Results  Component Value Date   HGBA1C 6.5 11/05/2021   No results found for: "WBC", "HGB", "HCT", "MCV", "PLT" Lab Results  Component Value Date   ALT 19 01/31/2022   AST 37 01/31/2022   ALKPHOS 128 (H) 01/31/2022   BILITOT 0.4 01/31/2022   No results found for: "25OHVITD2", "25OHVITD3", "VD25OH"   Review of Systems  Constitutional:  Negative for chills and fever.  HENT:  Negative for drooling, ear discharge, ear pain and sore throat.   Respiratory:  Negative for cough, shortness of breath and wheezing.   Cardiovascular:  Negative for chest pain, palpitations and leg swelling.  Gastrointestinal:  Negative for abdominal pain, blood in stool, constipation, diarrhea and nausea.  Endocrine: Negative for polydipsia.  Genitourinary:  Negative for dysuria, frequency, hematuria and urgency.  Musculoskeletal:  Negative for back pain, myalgias and neck pain.  Skin:  Negative for rash.  Allergic/Immunologic: Negative for environmental allergies.  Neurological:  Negative for dizziness and headaches.  Hematological:  Does not bruise/bleed easily.  Psychiatric/Behavioral:  Negative for suicidal ideas. The patient is not nervous/anxious.     Patient Active Problem  List   Diagnosis Date Noted   Type 2 diabetes mellitus with hyperglycemia, without long-term current use of insulin ([place]) 12/07/2020    No Known Allergies  Past Surgical History:  Procedure Laterality Date   COLONOSCOPY     TOOTH EXTRACTION      Social History   Tobacco Use   Smoking status: Every Day    Packs/day: 0.75    Years: 31.00    Total pack years: 23.25    Types: Cigarettes   Smokeless tobacco: Never  Substance Use Topics   Alcohol use: Yes    Comment: occasion   Drug use: Never     Medication list has been reviewed and updated.  No outpatient medications have been marked as taking for the 04/04/22 encounter (Office Visit) with Juline Patch, MD.       04/04/2022   11:41 AM 01/31/2022    9:00 AM 10/01/2021    8:35 AM 12/07/2020    7:58 AM  GAD 7 : Generalized Anxiety Score  Nervous, Anxious, on Edge 0 0 0 0  Control/stop worrying 0 0 0 0  Worry too much - different things 0 1 0 0  Trouble relaxing 0 1 0 0  Restless 0 1 0 0  Easily annoyed or irritable 0 0 0 0  Afraid - awful might happen 0 0 0 0  Total GAD 7 Score 0 3 0 0  Anxiety Difficulty Not difficult at all Not difficult  at all Not difficult at all        04/04/2022   11:41 AM 01/31/2022    8:59 AM 10/01/2021    8:34 AM  Depression screen PHQ 2/9  Decreased Interest 0 1 0  Down, Depressed, Hopeless 0 0 0  PHQ - 2 Score 0 1 0  Altered sleeping 0 1 0  Tired, decreased energy 0 1 0  Change in appetite 0 0 0  Feeling bad or failure about yourself  0 0 0  Trouble concentrating 0 1 0  Moving slowly or fidgety/restless 0 0 0  Suicidal thoughts 0 0 0  PHQ-9 Score 0 4 0  Difficult doing work/chores Not difficult at all Not difficult at all Not difficult at all    BP Readings from Last 3 Encounters:  04/04/22 120/80  01/31/22 122/78  10/18/21 130/62    Physical Exam  Wt Readings from Last 3 Encounters:  04/04/22 214 lb (97.1 kg)  01/31/22 216 lb (98 kg)  10/18/21 214 lb (97.1 kg)     BP 120/80   Pulse 76   Ht _0  (1.803 m)   Wt 214 lb (97.1 kg)   BMI 29.85 kg/m   Assessment and Plan:  1. Annual physical exam No subjective/objective concerns noted.  History of present illness review of past medical review of systems, and physical exam.Cory Thompson is a 61 y.o. male who presents today for his Complete Annual Exam. He feels well. He reports exercising minimal. He reports he is sleeping fairly well.   Labs have been obtained from previous exam in March acceptable at this time. 2. Need for immunization against influenza Discussed and administered. - Flu Vaccine QUAD 17moIM (Fluarix, Fluzone & Alfiuria Quad PF)    DOtilio Miu MD

## 2022-06-06 ENCOUNTER — Ambulatory Visit: Payer: BC Managed Care – PPO | Admitting: Family Medicine

## 2022-06-29 ENCOUNTER — Other Ambulatory Visit: Payer: Self-pay | Admitting: Family Medicine

## 2022-06-29 DIAGNOSIS — I10 Essential (primary) hypertension: Secondary | ICD-10-CM

## 2022-06-29 NOTE — Telephone Encounter (Signed)
Requested Prescriptions  Pending Prescriptions Disp Refills   amLODipine-benazepril (LOTREL) 10-40 MG capsule [Pharmacy Med Name: AMLODIPINE-BENAZEPRIL 10-40 MG] 90 capsule 0    Sig: TAKE 1 CAPSULE BY MOUTH EVERY DAY     Cardiovascular: CCB + ACEI Combos Passed - 06/29/2022  1:37 AM      Passed - Cr in normal range and within 180 days    Creatinine, Ser  Date Value Ref Range Status  01/31/2022 1.14 0.76 - 1.27 mg/dL Final   Creatinine, Urine  Date Value Ref Range Status  07/10/2020 185 mg/dL Final         Passed - K in normal range and within 180 days    Potassium  Date Value Ref Range Status  01/31/2022 4.5 3.5 - 5.2 mmol/L Final         Passed - Na in normal range and within 180 days    Sodium  Date Value Ref Range Status  01/31/2022 138 134 - 144 mmol/L Final         Passed - eGFR is 30 or above and within 180 days    GFR, Estimated  Date Value Ref Range Status  07/10/2020 >60 >60 mL/min Final    Comment:    (NOTE) Calculated using the CKD-EPI Creatinine Equation (2021)    eGFR  Date Value Ref Range Status  01/31/2022 74 >59 mL/min/1.73 Final         Passed - Patient is not pregnant      Passed - Last BP in normal range    BP Readings from Last 1 Encounters:  04/04/22 120/80         Passed - Valid encounter within last 6 months    Recent Outpatient Visits           2 months ago Annual physical exam   [facility] [place], Deanna C, MD   4 months ago Primary hypertension   [facility] [place], [place]oz[place], MD   8 months ago Left flank pain   [facility] [place], [place], MD   9 months ago Annual physical exam   [facility] [place], [place], MD   11 months ago Primary hypertension    [facility] [place],  [place], MD       Future Appointments             In 1 week Juline Patch, MD [facility] at [facility], [place]   In 1 month Juline Patch, MD [facility] at [facility], [facility]

## 2022-07-06 ENCOUNTER — Other Ambulatory Visit: Payer: Self-pay | Admitting: Family Medicine

## 2022-07-11 ENCOUNTER — Ambulatory Visit: Payer: BC Managed Care – PPO | Admitting: Family Medicine

## 2022-07-11 ENCOUNTER — Encounter: Payer: Self-pay | Admitting: Family Medicine

## 2022-07-11 ENCOUNTER — Other Ambulatory Visit: Payer: Self-pay | Admitting: Family Medicine

## 2022-07-11 VITALS — BP 106/70 | HR 64 | Ht 71.0 in | Wt 214.0 lb

## 2022-07-11 DIAGNOSIS — F17210 Nicotine dependence, cigarettes, uncomplicated: Secondary | ICD-10-CM

## 2022-07-11 DIAGNOSIS — E782 Mixed hyperlipidemia: Secondary | ICD-10-CM | POA: Diagnosis not present

## 2022-07-11 DIAGNOSIS — I10 Essential (primary) hypertension: Secondary | ICD-10-CM | POA: Diagnosis not present

## 2022-07-11 DIAGNOSIS — E1165 Type 2 diabetes mellitus with hyperglycemia: Secondary | ICD-10-CM | POA: Diagnosis not present

## 2022-07-11 MED ORDER — ATORVASTATIN CALCIUM 40 MG PO TABS: 40.0000 mg | ORAL_TABLET | Freq: Every day | ORAL | 1 refills | Status: DC

## 2022-07-11 MED ORDER — AMLODIPINE BESY-BENAZEPRIL HCL 10-40 MG PO CAPS: 1.0000 | ORAL_CAPSULE | Freq: Every day | ORAL | 1 refills | Status: DC

## 2022-07-11 NOTE — Patient Instructions (Signed)
..  mmc

## 2022-07-11 NOTE — Progress Notes (Signed)
Date:  07/11/2022   Name:  Cory Thompson   DOB:  1961/06/03   MRN:  [id number]   Chief Complaint: Hypertension and Diabetes  Hypertension This is a chronic problem. The current episode started more than 1 year ago. The problem has been gradually improving since onset. The problem is controlled. Pertinent negatives include no blurred vision, chest pain, headaches, neck pain, orthopnea, palpitations, peripheral edema, PND, shortness of breath or sweats. There are no associated agents to hypertension. There are no known risk factors for coronary artery disease.  Diabetes Pertinent negatives for hypoglycemia include no dizziness, headaches, nervousness/anxiousness or sweats. Pertinent negatives for diabetes include no blurred vision, no chest pain and no polydipsia.  Hyperlipidemia This is a chronic problem. The current episode started more than 1 year ago. The problem is controlled. Recent lipid tests were reviewed and are normal. Exacerbating diseases include diabetes. Pertinent negatives include no chest pain, myalgias or shortness of breath. Current antihyperlipidemic treatment includes statins. The current treatment provides moderate improvement of lipids. There are no compliance problems.     Lab Results  Component Value Date   NA 138 01/31/2022   K 4.5 01/31/2022   CO2 22 01/31/2022   GLUCOSE 98 01/31/2022   BUN 13 01/31/2022   CREATININE 1.14 01/31/2022   CALCIUM 10.2 01/31/2022   EGFR 74 01/31/2022   GFRNONAA >60 07/10/2020   Lab Results  Component Value Date   CHOL 164 01/31/2022   HDL 36 (L) 01/31/2022   LDLCALC 106 (H) 01/31/2022   TRIG 119 01/31/2022   No results found for: "TSH" Lab Results  Component Value Date   HGBA1C 6.5 11/05/2021   No results found for: "WBC", "HGB", "HCT", "MCV", "PLT" Lab Results  Component Value Date   ALT 19 01/31/2022   AST 37 01/31/2022   ALKPHOS 128 (H) 01/31/2022   BILITOT 0.4 01/31/2022   No results found for: "25OHVITD2",  "25OHVITD3", "VD25OH"   Review of Systems  Constitutional:  Negative for chills and fever.  HENT:  Negative for drooling, ear discharge, ear pain and sore throat.   Eyes:  Negative for blurred vision.  Respiratory:  Negative for cough, shortness of breath and wheezing.   Cardiovascular:  Negative for chest pain, palpitations, orthopnea, leg swelling and PND.  Gastrointestinal:  Negative for abdominal pain, blood in stool, constipation, diarrhea and nausea.  Endocrine: Negative for polydipsia.  Genitourinary:  Negative for dysuria, frequency, hematuria and urgency.  Musculoskeletal:  Negative for back pain, myalgias and neck pain.  Skin:  Negative for rash.  Allergic/Immunologic: Negative for environmental allergies.  Neurological:  Negative for dizziness and headaches.  Hematological:  Does not bruise/bleed easily.  Psychiatric/Behavioral:  Negative for suicidal ideas. The patient is not nervous/anxious.     Patient Active Problem List   Diagnosis Date Noted   Type 2 diabetes mellitus with hyperglycemia, without long-term current use of insulin ([place]) 12/07/2020   Essential hypertension 05/14/2018   Cigarette nicotine dependence, uncomplicated 04/88/8916   History of stomach ulcers 05/14/2018   Benign prostatic hyperplasia with urinary obstruction 11/29/2017   Sleep apnea 07/25/2017    No Known Allergies  Past Surgical History:  Procedure Laterality Date   COLONOSCOPY     TOOTH EXTRACTION      Social History   Tobacco Use   Smoking status: Every Day    Packs/day: 0.75    Years: 31.00    Total pack years: 23.25    Types: Cigarettes   Smokeless tobacco: Never  Substance Use Topics   Alcohol use: Yes    Comment: occasion   Drug use: Never     Medication list has been reviewed and updated.  Current Meds  Medication Sig   amLODipine-benazepril (LOTREL) 10-40 MG capsule TAKE 1 CAPSULE BY MOUTH EVERY DAY   atorvastatin (LIPITOR) 40 MG tablet Take 1 tablet (40 mg  total) by mouth daily.   glucose blood test strip Check your sugar in the morning before you eat breakfast, and one hour after a meal.   glucose monitoring kit (FREESTYLE) monitoring kit 1 each by Does not apply route daily. Check glucose once in the morning before breakfast and 1 hour after a meal   SYNJARDY XR 12.11-998 MG TB24 Take 2 tablets by mouth every morning. Dr Honor Junes       07/11/2022   10:17 AM 04/04/2022   11:41 AM 01/31/2022    9:00 AM 10/01/2021    8:35 AM  GAD 7 : Generalized Anxiety Score  Nervous, Anxious, on Edge 0 0 0 0  Control/stop worrying 0 0 0 0  Worry too much - different things 0 0 1 0  Trouble relaxing 0 0 1 0  Restless 0 0 1 0  Easily annoyed or irritable 0 0 0 0  Afraid - awful might happen 0 0 0 0  Total GAD 7 Score 0 0 3 0  Anxiety Difficulty Not difficult at all Not difficult at all Not difficult at all Not difficult at all       07/11/2022   10:16 AM 04/04/2022   11:41 AM 01/31/2022    8:59 AM  Depression screen PHQ 2/9  Decreased Interest 0 0 1  Down, Depressed, Hopeless 0 0 0  PHQ - 2 Score 0 0 1  Altered sleeping 0 0 1  Tired, decreased energy 0 0 1  Change in appetite 0 0 0  Feeling bad or failure about yourself  0 0 0  Trouble concentrating 0 0 1  Moving slowly or fidgety/restless 0 0 0  Suicidal thoughts 0 0 0  PHQ-9 Score 0 0 4  Difficult doing work/chores Not difficult at all Not difficult at all Not difficult at all    BP Readings from Last 3 Encounters:  07/11/22 106/70  04/04/22 120/80  01/31/22 122/78    Physical Exam Vitals and nursing note reviewed.  HENT:     Head: Normocephalic.     Right Ear: Tympanic membrane and external ear normal.     Left Ear: Tympanic membrane and external ear normal.     Nose: Nose normal.     Mouth/Throat:     Mouth: Mucous membranes are moist.  Eyes:     General: No scleral icterus.       Right eye: No discharge.        Left eye: No discharge.     Conjunctiva/sclera: Conjunctivae  normal.     Pupils: Pupils are equal, round, and reactive to light.  Neck:     Thyroid: No thyromegaly.     Vascular: No carotid bruit or JVD.     Trachea: No tracheal deviation.  Cardiovascular:     Rate and Rhythm: Normal rate and regular rhythm.     Pulses:          Radial pulses are 2+ on the right side and 2+ on the left side.     Heart sounds: Normal heart sounds, S1 normal and S2 normal. No murmur heard.    No  systolic murmur is present.     No diastolic murmur is present.     No friction rub. No gallop. No S3 or S4 sounds.  Pulmonary:     Effort: No respiratory distress.     Breath sounds: Normal breath sounds. No wheezing, rhonchi or rales.  Abdominal:     General: Bowel sounds are normal.     Palpations: Abdomen is soft. There is no hepatomegaly, splenomegaly or mass.     Tenderness: There is no abdominal tenderness. There is no guarding or rebound.  Musculoskeletal:        General: No tenderness. Normal range of motion.     Cervical back: Neck supple. No tenderness.  Lymphadenopathy:     Cervical: No cervical adenopathy.  Skin:    General: Skin is warm.     Findings: No rash.  Neurological:     Mental Status: He is alert.     Wt Readings from Last 3 Encounters:  07/11/22 214 lb (97.1 kg)  04/04/22 214 lb (97.1 kg)  01/31/22 216 lb (98 kg)    BP 106/70   Pulse 64   Ht _0  (1.803 m)   Wt 214 lb (97.1 kg)   SpO2 96%   BMI 29.85 kg/m   Assessment and Plan:  1. Primary hypertension Chronic.  Controlled.  Stable.  Blood pressure 106/70.  Continue amlodipine benazepril 10-40 mg once a day. - amLODipine-benazepril (LOTREL) 10-40 MG capsule; Take 1 capsule by mouth daily.  Dispense: 90 capsule; Refill: 1  2. Moderate mixed hyperlipidemia not requiring statin therapy Chronic.  Controlled.  Stable.  Continue atorvastatin 40 mg once a day. - atorvastatin (LIPITOR) 40 MG tablet; Take 1 tablet (40 mg total) by mouth daily.  Dispense: 90 tablet; Refill: 1  3.  Type 2 diabetes mellitus with hyperglycemia, without long-term current use of insulin (HCC) Chronic.  Controlled.  Stable.  Followed by endocrinology.  Will check microalbuminuria and A1c for work purposes. - Microalbumin / creatinine urine ratio - Hemoglobin A1c  4. Cigarette nicotine dependence, uncomplicated Patient has been advised of the health risks of smoking and counseled concerning cessation of tobacco products. I spent over 3 minutes for discussion and to answer questions.     Otilio Miu, MD

## 2022-07-12 LAB — MICROALBUMIN / CREATININE URINE RATIO
Creatinine, Urine: 80.1 mg/dL
Microalb/Creat Ratio: 71 mg/g creat — ABNORMAL HIGH (ref 0–29)
Microalbumin, Urine: 56.7 ug/mL

## 2022-07-12 LAB — HEMOGLOBIN A1C
Est. average glucose Bld gHb Est-mCnc: 146 mg/dL
Hgb A1c MFr Bld: 6.7 % — ABNORMAL HIGH (ref 4.8–5.6)

## 2022-08-01 DIAGNOSIS — E1159 Type 2 diabetes mellitus with other circulatory complications: Secondary | ICD-10-CM | POA: Diagnosis not present

## 2022-08-01 DIAGNOSIS — E785 Hyperlipidemia, unspecified: Secondary | ICD-10-CM | POA: Diagnosis not present

## 2022-08-01 DIAGNOSIS — E1169 Type 2 diabetes mellitus with other specified complication: Secondary | ICD-10-CM | POA: Diagnosis not present

## 2022-08-01 DIAGNOSIS — E119 Type 2 diabetes mellitus without complications: Secondary | ICD-10-CM | POA: Diagnosis not present

## 2022-08-03 ENCOUNTER — Ambulatory Visit: Payer: BC Managed Care – PPO | Admitting: Family Medicine

## 2022-09-18 ENCOUNTER — Ambulatory Visit
Admission: EM | Admit: 2022-09-18 | Discharge: 2022-09-18 | Disposition: A | Discharge status: Home / Self Care | Payer: BC Managed Care – PPO | Attending: Family Medicine | Admitting: Family Medicine

## 2022-09-18 DIAGNOSIS — J069 Acute upper respiratory infection, unspecified: Secondary | ICD-10-CM | POA: Diagnosis not present

## 2022-09-18 DIAGNOSIS — J441 Chronic obstructive pulmonary disease with (acute) exacerbation: Secondary | ICD-10-CM

## 2022-09-18 HISTORY — DX: Chronic obstructive pulmonary disease, unspecified: J44.9

## 2022-09-18 MED ORDER — LEVOFLOXACIN 500 MG PO TABS: 500.0000 mg | ORAL_TABLET | Freq: Every day | ORAL | 0 refills | Status: DC

## 2022-09-18 MED ORDER — ALBUTEROL SULFATE HFA 108 (90 BASE) MCG/ACT IN AERS: 2.0000 | INHALATION_SPRAY | RESPIRATORY_TRACT | 0 refills | Status: DC | PRN

## 2022-09-18 MED ORDER — AEROCHAMBER MV MISC: 2 refills | Status: AC

## 2022-09-18 MED ORDER — IPRATROPIUM BROMIDE 0.06 % NA SOLN: 2.0000 | Freq: Four times a day (QID) | NASAL | 12 refills | Status: AC

## 2022-09-18 MED ORDER — BENZONATATE 100 MG PO CAPS: 200.0000 mg | ORAL_CAPSULE | Freq: Three times a day (TID) | ORAL | 0 refills | Status: DC

## 2022-09-18 MED ORDER — PREDNISONE 20 MG PO TABS: 60.0000 mg | ORAL_TABLET | Freq: Every day | ORAL | 0 refills | Status: AC

## 2022-09-18 MED ORDER — PROMETHAZINE-DM 6.25-15 MG/5ML PO SYRP: 5.0000 mL | ORAL_SOLUTION | Freq: Four times a day (QID) | ORAL | 0 refills | Status: DC | PRN

## 2022-09-18 NOTE — Discharge Instructions (Signed)
Take The Levaquin daily for 7 days for your COPD and URI.  Use your albuterol inhaler, 2 puffs every 4-6 hours as needed for shortness breath or wheezing.  Take the prednisone 60 mg daily at breakfast time starting tomorrow.  Use the Tessalon Perles every 8 hours as needed for cough during the day.  They will sometimes cause numbness to the base of your tongue or give you metallic taste in her mouth.  This is normal.  You will need to take them with a small sip of water.  Use the Promethazine DM cough syrup at bedtime as needed for cough and congestion.  Use the Atrovent nasal spray, 2 squirts in each nostril every 6 hours as needed for congestion.  Return for reevaluation for new or worsening symptoms.

## 2022-09-18 NOTE — ED Triage Notes (Signed)
Pt states that he's had congestion in chest. Nasal congestion x3 weeks. Fatigue. Smokes. Dx with COPD. Pt states that he had some chest pain last night that lasted a few minutes. Nothing today.

## 2022-09-18 NOTE — ED Provider Notes (Signed)
[facility]    CSN: [id number] Arrival date & time: 09/18/22  1411      History   Chief Complaint Chief Complaint  Patient presents with   Cough    HPI Cory Thompson is a 62 y.o. male.   HPI  62 year old male here for evaluation of respiratory complaints.  The patient reports that he has been experiencing nasal congestion for longer than 3 weeks and has been experiencing a cough for the last 3 weeks.  His nasal discharge is clear and his sputum production is clear as well.  He states that he does have shortness of breath and wheezing with activity and when he lays down.  He denies any fever.  He is a smoker and has a history of COPD, hypertension, and diabetes.  He is not on any inhalers or daily medication for his COPD.  Past Medical History:  Diagnosis Date   COPD (chronic obstructive pulmonary disease) ([place])    Diabetes mellitus without complication ([place])    Hypertension    Pre-diabetes     Patient Active Problem List   Diagnosis Date Noted   Type 2 diabetes mellitus with hyperglycemia, without long-term current use of insulin ([place]) 12/07/2020   Essential hypertension 05/14/2018   Cigarette nicotine dependence, uncomplicated [id number]   History of stomach ulcers 05/14/2018   Benign prostatic hyperplasia with urinary obstruction 11/29/2017   Sleep apnea 07/25/2017    Past Surgical History:  Procedure Laterality Date   COLONOSCOPY     TOOTH EXTRACTION         Home Medications    Prior to Admission medications   Medication Sig Start Date End Date Taking? Authorizing Provider  albuterol (VENTOLIN HFA) 108 (90 Base) MCG/ACT inhaler Inhale 2 puffs into the lungs every 4 (four) hours as needed. 09/18/22  Yes Margarette Canada, NP  amLODipine-benazepril (LOTREL) 10-40 MG capsule Take 1 capsule by mouth daily. 07/11/22  Yes Juline Patch, MD  atorvastatin (LIPITOR) 40 MG tablet Take 1 tablet (40 mg total) by mouth daily. 07/11/22  Yes Juline Patch, MD   benzonatate (TESSALON) 100 MG capsule Take 2 capsules (200 mg total) by mouth every 8 (eight) hours. 09/18/22  Yes Margarette Canada, NP  glucose blood test strip Check your sugar in the morning before you eat breakfast, and one hour after a meal. 06/06/20  Yes Melynda Ripple, MD  glucose monitoring kit (FREESTYLE) monitoring kit 1 each by Does not apply route daily. Check glucose once in the morning before breakfast and 1 hour after a meal 06/06/20  Yes Melynda Ripple, MD  ipratropium (ATROVENT) 0.06 % nasal spray Place 2 sprays into both nostrils 4 (four) times daily. 09/18/22  Yes Margarette Canada, NP  levofloxacin (LEVAQUIN) 500 MG tablet Take 1 tablet (500 mg total) by mouth daily. 09/18/22  Yes Margarette Canada, NP  predniSONE (DELTASONE) 20 MG tablet Take 3 tablets (60 mg total) by mouth daily with breakfast for 5 days. 3 tablets daily for 5 days. 09/18/22 09/23/22 Yes Margarette Canada, NP  promethazine-dextromethorphan (PROMETHAZINE-DM) 6.25-15 MG/5ML syrup Take 5 mLs by mouth 4 (four) times daily as needed. 09/18/22  Yes Margarette Canada, NP  Spacer/Aero-Holding Chambers (AEROCHAMBER MV) inhaler Use as instructed 09/18/22  Yes Margarette Canada, NP  SYNJARDY XR 12.11-998 MG TB24 Take 2 tablets by mouth every morning. Dr Honor Junes 08/16/21  Yes [provider]    Family History Family History  Problem Relation Age of Onset   Diabetes Mother    Hypertension  Mother    Hypertension Father     Social History Social History   Tobacco Use   Smoking status: Every Day    Packs/day: 0.75    Years: 31.00    Total pack years: 23.25    Types: Cigarettes   Smokeless tobacco: Never  Substance Use Topics   Alcohol use: Yes    Comment: occasion   Drug use: Never     Allergies   Patient has no known allergies.   Review of Systems Review of Systems  Constitutional:  Negative for fever.  HENT:  Positive for congestion and rhinorrhea. Negative for sore throat.   Respiratory:  Positive for cough,  shortness of breath and wheezing.      Physical Exam Triage Vital Signs ED Triage Vitals  Enc Vitals Group     BP 09/18/22 1502 132/84     Pulse Rate 09/18/22 1502 78     Resp 09/18/22 1502 16     Temp 09/18/22 1502 97.9 F (36.6 C)     Temp Source 09/18/22 1502 Oral     SpO2 09/18/22 1502 96 %     Weight 09/18/22 1500 216 lb (98 kg)     Height --      Head Circumference --      Peak Flow --      Pain Score 09/18/22 1500 0     Pain Loc --      Pain Edu? --      Excl. in [place]? --    No data found.  Updated Vital Signs BP 132/84 (BP Location: Right Arm)   Pulse 78   Temp 97.9 F (36.6 C) (Oral)   Resp 16   Wt 216 lb (98 kg)   SpO2 96%   BMI 30.13 kg/m   Visual Acuity Right Eye Distance:   Left Eye Distance:   Bilateral Distance:    Right Eye Near:   Left Eye Near:    Bilateral Near:     Physical Exam Vitals and nursing note reviewed.  Constitutional:      Appearance: Normal appearance. He is not ill-appearing.  HENT:     Head: Normocephalic and atraumatic.     Right Ear: Tympanic membrane, ear canal and external ear normal. There is no impacted cerumen.     Left Ear: Tympanic membrane, ear canal and external ear normal. There is no impacted cerumen.     Nose: Congestion and rhinorrhea present.     Comments: Nasal mucosa is erythematous and edematous with clear discharge in both nares.    Mouth/Throat:     Mouth: Mucous membranes are moist.     Pharynx: Oropharynx is clear. No oropharyngeal exudate or posterior oropharyngeal erythema.  Cardiovascular:     Rate and Rhythm: Normal rate and regular rhythm.     Pulses: Normal pulses.     Heart sounds: Normal heart sounds. No murmur heard.    No friction rub. No gallop.  Pulmonary:     Effort: Pulmonary effort is normal.     Breath sounds: Normal breath sounds. No wheezing, rhonchi or rales.  Musculoskeletal:     Cervical back: Normal range of motion and neck supple.  Lymphadenopathy:     Cervical: No  cervical adenopathy.  Skin:    General: Skin is warm and dry.     Capillary Refill: Capillary refill takes less than 2 seconds.     Findings: No rash.  Neurological:     General: No focal deficit present.  Mental Status: He is alert and oriented to person, place, and time.      UC Treatments / Results  Labs (all labs ordered are listed, but only abnormal results are displayed) Labs Reviewed - No data to display  EKG   Radiology No results found.  Procedures Procedures (including critical care time)  Medications Ordered in UC Medications - No data to display  Initial Impression / Assessment and Plan / UC Course  I have reviewed the triage vital signs and the nursing notes.  Pertinent labs & imaging results that were available during my care of the patient were reviewed by me and considered in my medical decision making (see chart for details).   Patient is a pleasant, nontoxic-appearing 62 year old male with history of COPD who presents for evaluation of 3 weeks worth of cough with associated shortness breath and wheezing and nasal congestion that is gone on for much longer.  He states that he has been using over-the-counter nasal spray every other day to help with the congestion but if he uses it more frequently it causes nosebleeds.  He states that he is not on any medication or inhalers daily for his COPD.  Currently he is not in any acute respiratory distress as he is able to speak in full sentence without dyspnea or tachypnea.  His room air oxygen saturation is 96% and his respiratory to 16.  He is afebrile at 97 9.  His exam reveals inflammation of his upper respiratory tract with inflamed nasal mucosa and clear rhinorrhea.  Cardiopulmonary exam reveals normal chest excursion and clear lung sounds in all fields.  Given patient's history of COPD and his greater than through history of nasal congestion I will treat him for an upper respiratory infection and COPD exacerbation.   I am going to put him on Levaquin 500 milligrams once daily for 7 days for treatment of his upper respiratory infection and COPD exacerbation.  Muscular start him on for 60 mg of prednisone tomorrow that he can take daily for 5 days to help with pulmonary inflammation.  I have prescribed him an albuterol inhaler with a spacer and have instructed him to use 1 to 2 puffs every 4-6 hours to help with shortness breath and wheezing.  Atrovent nasal spray to help with nasal congestion, and Tessalon Perles and Promethazine DM cough syrup to with cough and congestion.  Return precautions reviewed.   Final Clinical Impressions(s) / UC Diagnoses   Final diagnoses:  Upper respiratory tract infection, unspecified type  COPD exacerbation ([place])     Discharge Instructions      Take The Levaquin daily for 7 days for your COPD and URI.  Use your albuterol inhaler, 2 puffs every 4-6 hours as needed for shortness breath or wheezing.  Take the prednisone 60 mg daily at breakfast time starting tomorrow.  Use the Tessalon Perles every 8 hours as needed for cough during the day.  They will sometimes cause numbness to the base of your tongue or give you metallic taste in her mouth.  This is normal.  You will need to take them with a small sip of water.  Use the Promethazine DM cough syrup at bedtime as needed for cough and congestion.  Use the Atrovent nasal spray, 2 squirts in each nostril every 6 hours as needed for congestion.  Return for reevaluation for new or worsening symptoms.      ED Prescriptions     Medication Sig Dispense Auth. Provider   benzonatate (  TESSALON) 100 MG capsule Take 2 capsules (200 mg total) by mouth every 8 (eight) hours. 21 capsule Margarette Canada, NP   ipratropium (ATROVENT) 0.06 % nasal spray Place 2 sprays into both nostrils 4 (four) times daily. 15 mL Margarette Canada, NP   promethazine-dextromethorphan (PROMETHAZINE-DM) 6.25-15 MG/5ML syrup Take 5 mLs by mouth 4 (four) times  daily as needed. 118 mL Margarette Canada, NP   levofloxacin (LEVAQUIN) 500 MG tablet Take 1 tablet (500 mg total) by mouth daily. 7 tablet Margarette Canada, NP   albuterol (VENTOLIN HFA) 108 (90 Base) MCG/ACT inhaler Inhale 2 puffs into the lungs every 4 (four) hours as needed. 18 g Margarette Canada, NP   Spacer/Aero-Holding Chambers (AEROCHAMBER MV) inhaler Use as instructed 1 each Margarette Canada, NP   predniSONE (DELTASONE) 20 MG tablet Take 3 tablets (60 mg total) by mouth daily with breakfast for 5 days. 3 tablets daily for 5 days. 15 tablet Margarette Canada, NP      PDMP not reviewed this encounter.   Margarette Canada, NP 09/18/22 [phone]

## 2022-10-03 ENCOUNTER — Ambulatory Visit
Admission: RE | Admit: 2022-10-03 | Discharge: 2022-10-03 | Disposition: A | Discharge status: Home / Self Care | Payer: BC Managed Care – PPO | Source: Ambulatory Visit | Attending: Acute Care | Admitting: Acute Care

## 2022-10-03 DIAGNOSIS — F17210 Nicotine dependence, cigarettes, uncomplicated: Secondary | ICD-10-CM | POA: Insufficient documentation

## 2022-10-03 DIAGNOSIS — Z87891 Personal history of nicotine dependence: Secondary | ICD-10-CM | POA: Diagnosis not present

## 2022-10-06 ENCOUNTER — Telehealth: Payer: Self-pay | Admitting: Acute Care

## 2022-10-06 ENCOUNTER — Other Ambulatory Visit: Payer: Self-pay | Admitting: Acute Care

## 2022-10-06 DIAGNOSIS — Z122 Encounter for screening for malignant neoplasm of respiratory organs: Secondary | ICD-10-CM

## 2022-10-06 DIAGNOSIS — Z87891 Personal history of nicotine dependence: Secondary | ICD-10-CM

## 2022-10-06 DIAGNOSIS — F17210 Nicotine dependence, cigarettes, uncomplicated: Secondary | ICD-10-CM

## 2022-10-06 NOTE — Telephone Encounter (Signed)
Spoke with pt regarding lung screening CT results. Stable lung scan with small nodules seen that will be followed on his yearly lung screening scan.  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  Renal lesion seen with recommended MRI of abdomen. Pt advised that I have sent these results to Dr Otilio Miu and he needs to follow up with her office to get this scehduled. Pt verbalized understanding.

## 2022-10-06 NOTE — Telephone Encounter (Signed)
I have attempted to call the patient with the results of their  Low Dose CT Chest Lung cancer screening scan. There was no answer. I have left a HIPPA compliant VM requesting the patient call the office for the scan results. I included the office contact information in the message. We will await his return call. If no return call we will continue to call until patient is contacted.    Scan was read as a LR 2, but incidental finding of Partially visualized ill-defined left renal lesion, better visualized on prior exam. Recommend contrast-enhanced abdominal MRI for further evaluation if not previously performed.  12 month follow up low dose CT Chest. When he returns the call we can review the incidental finding of the left renal lesion, and let his PCP know recommendation is for contrast-enhanced abdominal MRI .   Thanks so much

## 2022-10-17 ENCOUNTER — Encounter: Payer: Self-pay | Admitting: Family Medicine

## 2022-10-17 ENCOUNTER — Ambulatory Visit: Payer: BC Managed Care – PPO | Admitting: Family Medicine

## 2022-10-17 VITALS — BP 120/70 | HR 86 | Ht 71.0 in | Wt 213.0 lb

## 2022-10-17 DIAGNOSIS — I2510 Atherosclerotic heart disease of native coronary artery without angina pectoris: Secondary | ICD-10-CM | POA: Diagnosis not present

## 2022-10-17 DIAGNOSIS — R93429 Abnormal radiologic findings on diagnostic imaging of unspecified kidney: Secondary | ICD-10-CM | POA: Diagnosis not present

## 2022-10-17 DIAGNOSIS — J438 Other emphysema: Secondary | ICD-10-CM | POA: Diagnosis not present

## 2022-10-17 DIAGNOSIS — F17210 Nicotine dependence, cigarettes, uncomplicated: Secondary | ICD-10-CM

## 2022-10-17 MED ORDER — ALBUTEROL SULFATE HFA 108 (90 BASE) MCG/ACT IN AERS: 2.0000 | INHALATION_SPRAY | RESPIRATORY_TRACT | 0 refills | Status: DC | PRN

## 2022-10-17 NOTE — Progress Notes (Signed)
Date:  10/17/2022   Name:  Cory Thompson   DOB:  Dec 11, 1960   MRN:  [id number]   Chief Complaint: COPD (Refill albuterol inhaler) and renal lesion (Needs MRI ordered)  COPD He complains of chest tightness, cough, shortness of breath, sputum production and wheezing. There is no difficulty breathing or frequent throat clearing. This is a chronic problem. The current episode started more than 1 year ago. The problem occurs intermittently. The problem has been waxing and waning. The cough is productive of sputum. Pertinent negatives include no chest pain, dyspnea on exertion, fever, heartburn, nasal congestion, orthopnea or rhinorrhea. His past medical history is significant for COPD.    Lab Results  Component Value Date   NA 138 01/31/2022   K 4.5 01/31/2022   CO2 22 01/31/2022   GLUCOSE 98 01/31/2022   BUN 13 01/31/2022   CREATININE 1.14 01/31/2022   CALCIUM 10.2 01/31/2022   EGFR 74 01/31/2022   GFRNONAA >60 07/10/2020   Lab Results  Component Value Date   CHOL 164 01/31/2022   HDL 36 (L) 01/31/2022   LDLCALC 106 (H) 01/31/2022   TRIG 119 01/31/2022   No results found for: "TSH" Lab Results  Component Value Date   HGBA1C 6.7 (H) 07/11/2022   No results found for: "WBC", "HGB", "HCT", "MCV", "PLT" Lab Results  Component Value Date   ALT 19 01/31/2022   AST 37 01/31/2022   ALKPHOS 128 (H) 01/31/2022   BILITOT 0.4 01/31/2022   No results found for: "25OHVITD2", "25OHVITD3", "VD25OH"   Review of Systems  Constitutional:  Negative for fever.  HENT:  Negative for rhinorrhea.   Respiratory:  Positive for cough, sputum production, shortness of breath and wheezing. Negative for chest tightness and stridor.   Cardiovascular:  Negative for chest pain, dyspnea on exertion, palpitations and leg swelling.  Gastrointestinal:  Negative for blood in stool and heartburn.    Patient Active Problem List   Diagnosis Date Noted   Type 2 diabetes mellitus with hyperglycemia, without  long-term current use of insulin ([facility]) 12/07/2020   Essential hypertension 05/14/2018   Cigarette nicotine dependence, uncomplicated 123XX123   History of stomach ulcers 05/14/2018   Benign prostatic hyperplasia with urinary obstruction 11/29/2017   Sleep apnea 07/25/2017    No Known Allergies  Past Surgical History:  Procedure Laterality Date   COLONOSCOPY     TOOTH EXTRACTION      Social History   Tobacco Use   Smoking status: Every Day    Packs/day: 0.75    Years: 31.00    Additional pack years: 0.00    Total pack years: 23.25    Types: Cigarettes   Smokeless tobacco: Never  Substance Use Topics   Alcohol use: Yes    Comment: occasion   Drug use: Never     Medication list has been reviewed and updated.  Current Meds  Medication Sig   albuterol (VENTOLIN HFA) 108 (90 Base) MCG/ACT inhaler Inhale 2 puffs into the lungs every 4 (four) hours as needed.   amLODipine-benazepril (LOTREL) 10-40 MG capsule Take 1 capsule by mouth daily.   atorvastatin (LIPITOR) 40 MG tablet Take 1 tablet (40 mg total) by mouth daily.   glucose blood test strip Check your sugar in the morning before you eat breakfast, and one hour after a meal.   glucose monitoring kit (FREESTYLE) monitoring kit 1 each by Does not apply route daily. Check glucose once in the morning before breakfast and 1 hour after a  meal   ipratropium (ATROVENT) 0.06 % nasal spray Place 2 sprays into both nostrils 4 (four) times daily.   Spacer/Aero-Holding Chambers (AEROCHAMBER MV) inhaler Use as instructed   SYNJARDY XR 12.11-998 MG TB24 Take 2 tablets by mouth every morning. Dr Honor Junes   [DISCONTINUED] benzonatate (TESSALON) 100 MG capsule Take 2 capsules (200 mg total) by mouth every 8 (eight) hours.   [DISCONTINUED] levofloxacin (LEVAQUIN) 500 MG tablet Take 1 tablet (500 mg total) by mouth daily.       10/17/2022    9:52 AM 07/11/2022   10:17 AM 04/04/2022   11:41 AM 01/31/2022    9:00 AM  GAD 7 : Generalized  Anxiety Score  Nervous, Anxious, on Edge 0 0 0 0  Control/stop worrying 0 0 0 0  Worry too much - different things 0 0 0 1  Trouble relaxing 0 0 0 1  Restless 0 0 0 1  Easily annoyed or irritable 0 0 0 0  Afraid - awful might happen 0 0 0 0  Total GAD 7 Score 0 0 0 3  Anxiety Difficulty Not difficult at all Not difficult at all Not difficult at all Not difficult at all       10/17/2022    9:52 AM 07/11/2022   10:16 AM 04/04/2022   11:41 AM  Depression screen PHQ 2/9  Decreased Interest 0 0 0  Down, Depressed, Hopeless 0 0 0  PHQ - 2 Score 0 0 0  Altered sleeping 0 0 0  Tired, decreased energy 0 0 0  Change in appetite 0 0 0  Feeling bad or failure about yourself  0 0 0  Trouble concentrating 0 0 0  Moving slowly or fidgety/restless 0 0 0  Suicidal thoughts 0 0 0  PHQ-9 Score 0 0 0  Difficult doing work/chores  Not difficult at all Not difficult at all    BP Readings from Last 3 Encounters:  10/17/22 120/70  09/18/22 132/84  07/11/22 106/70    Physical Exam Vitals and nursing note reviewed.  HENT:     Head: Normocephalic.     Right Ear: Tympanic membrane, ear canal and external ear normal.     Left Ear: Tympanic membrane, ear canal and external ear normal.     Nose: Nose normal.     Mouth/Throat:     Mouth: Mucous membranes are moist.  Eyes:     General: No scleral icterus.       Right eye: No discharge.        Left eye: No discharge.     Conjunctiva/sclera: Conjunctivae normal.     Pupils: Pupils are equal, round, and reactive to light.  Neck:     Thyroid: No thyromegaly.     Vascular: No JVD.     Trachea: No tracheal deviation.  Cardiovascular:     Rate and Rhythm: Normal rate and regular rhythm.     Heart sounds: Normal heart sounds. No murmur heard.    No friction rub. No gallop.  Pulmonary:     Effort: No respiratory distress.     Breath sounds: Normal breath sounds. No wheezing, rhonchi or rales.  Abdominal:     General: Bowel sounds are normal.      Palpations: Abdomen is soft. There is no mass.     Tenderness: There is no abdominal tenderness. There is no guarding or rebound.  Musculoskeletal:        General: No tenderness. Normal range of motion.  Cervical back: Normal range of motion and neck supple.  Lymphadenopathy:     Cervical: No cervical adenopathy.  Skin:    General: Skin is warm.     Findings: No rash.  Neurological:     Mental Status: He is alert and oriented to person, place, and time.     Cranial Nerves: No cranial nerve deficit.     Deep Tendon Reflexes: Reflexes are normal and symmetric.     Wt Readings from Last 3 Encounters:  10/17/22 213 lb (96.6 kg)  10/03/22 218 lb (98.9 kg)  09/18/22 216 lb (98 kg)    BP 120/70   Pulse 86   Ht 5\' 11"  (1.803 m)   Wt 213 lb (96.6 kg)   SpO2 96%   BMI 29.71 kg/m   Assessment and Plan:  1. Paraseptal emphysema (HCC) Chronic.  Controlled.  Stable.  Patient continues to smoke but we had a long discussion that he does have centrilobular and paraseptal emphysema.  Will continue albuterol 2 puffs every 4-6 hours. - albuterol (VENTOLIN HFA) 108 (90 Base) MCG/ACT inhaler; Inhale 2 puffs into the lungs every 4 (four) hours as needed.  Dispense: 18 g; Refill: 0  2. Abnormal CT scan, kidney Patient has abnormal CT that was noted that needs further evaluation with MRI of the abdomen rest.  We also check urinalysis today and if microscopic hematuria is noted we may have to proceed in the direction of bladder evaluation. - MR Abdomen W Wo Contrast - Urinalysis, microscopic only  3. Continuous dependence on cigarette smoking Patient has been advised of the health risks of smoking and counseled concerning cessation of tobacco products. I spent over 3 minutes for discussion and to answer questions.  - Urinalysis, microscopic only  4. Coronary artery disease involving native heart without angina pectoris, unspecified vessel or lesion type Chronic.  Controlled.  Stable.   Patient was noted to have coronary artery disease on CT and once again we discussed the necessity to discontinue smoking we will also encourage him to take his atorvastatin to reduce risk of atherosclerotic buildup.   Otilio Miu, MD

## 2022-10-18 LAB — URINALYSIS, MICROSCOPIC ONLY
Bacteria, UA: NONE SEEN
Casts: NONE SEEN /lpf
Epithelial Cells (non renal): NONE SEEN /hpf (ref 0–10)
RBC, Urine: NONE SEEN /hpf (ref 0–2)
WBC, UA: NONE SEEN /hpf (ref 0–5)

## 2022-10-20 ENCOUNTER — Ambulatory Visit: Payer: BC Managed Care – PPO

## 2022-10-24 ENCOUNTER — Other Ambulatory Visit: Payer: BC Managed Care – PPO

## 2022-10-31 ENCOUNTER — Ambulatory Visit
Admission: RE | Admit: 2022-10-31 | Discharge: 2022-10-31 | Disposition: A | Discharge status: Home / Self Care | Payer: BC Managed Care – PPO | Source: Ambulatory Visit | Attending: Family Medicine | Admitting: Family Medicine

## 2022-10-31 DIAGNOSIS — K5730 Diverticulosis of large intestine without perforation or abscess without bleeding: Secondary | ICD-10-CM | POA: Diagnosis not present

## 2022-10-31 DIAGNOSIS — N281 Cyst of kidney, acquired: Secondary | ICD-10-CM | POA: Diagnosis not present

## 2022-10-31 DIAGNOSIS — R93429 Abnormal radiologic findings on diagnostic imaging of unspecified kidney: Secondary | ICD-10-CM | POA: Insufficient documentation

## 2022-10-31 MED ORDER — GADOBUTROL 1 MMOL/ML IV SOLN
10.0000 mL | Freq: Once | INTRAVENOUS | Status: AC | PRN
  Administered 2022-10-31: 10 mL via INTRAVENOUS

## 2023-01-06 ENCOUNTER — Encounter: Payer: Self-pay | Admitting: Family Medicine

## 2023-01-06 ENCOUNTER — Ambulatory Visit: Payer: BC Managed Care – PPO | Admitting: Family Medicine

## 2023-01-06 VITALS — BP 118/64 | HR 90 | Ht 71.0 in | Wt 214.0 lb

## 2023-01-06 DIAGNOSIS — F17210 Nicotine dependence, cigarettes, uncomplicated: Secondary | ICD-10-CM

## 2023-01-06 DIAGNOSIS — E1165 Type 2 diabetes mellitus with hyperglycemia: Secondary | ICD-10-CM

## 2023-01-06 DIAGNOSIS — R3914 Feeling of incomplete bladder emptying: Secondary | ICD-10-CM

## 2023-01-06 DIAGNOSIS — E782 Mixed hyperlipidemia: Secondary | ICD-10-CM

## 2023-01-06 DIAGNOSIS — N401 Enlarged prostate with lower urinary tract symptoms: Secondary | ICD-10-CM | POA: Diagnosis not present

## 2023-01-06 DIAGNOSIS — Z1211 Encounter for screening for malignant neoplasm of colon: Secondary | ICD-10-CM

## 2023-01-06 DIAGNOSIS — I10 Essential (primary) hypertension: Secondary | ICD-10-CM

## 2023-01-06 MED ORDER — TAMSULOSIN HCL 0.4 MG PO CAPS: 0.4000 mg | ORAL_CAPSULE | Freq: Every day | ORAL | 3 refills | Status: DC

## 2023-01-06 MED ORDER — ATORVASTATIN CALCIUM 40 MG PO TABS: 40.0000 mg | ORAL_TABLET | Freq: Every day | ORAL | 1 refills | Status: DC

## 2023-01-06 MED ORDER — AMLODIPINE BESY-BENAZEPRIL HCL 10-40 MG PO CAPS: 1.0000 | ORAL_CAPSULE | Freq: Every day | ORAL | 1 refills | Status: DC

## 2023-01-06 NOTE — Progress Notes (Signed)
Date:  01/06/2023   Name:  Cory Thompson   DOB:  1960/10/26   MRN:  [id number]   Chief Complaint: Hypertension, Hyperlipidemia, and Diabetes  Hypertension This is a chronic problem. The current episode started more than 1 year ago. The problem has been gradually improving since onset. The problem is controlled. Pertinent negatives include no anxiety, blurred vision, chest pain, headaches, malaise/fatigue, neck pain, orthopnea, palpitations, peripheral edema, PND, shortness of breath or sweats. There are no associated agents to hypertension. There are no known risk factors for coronary artery disease. Past treatments include ACE inhibitors and calcium channel blockers. The current treatment provides moderate improvement. There are no compliance problems.  There is no history of angina, kidney disease, CAD/MI, CVA, heart failure, left ventricular hypertrophy, PVD or retinopathy. There is no history of chronic renal disease, a hypertension causing med or renovascular disease.  Hyperlipidemia This is a chronic problem. The current episode started more than 1 year ago. Exacerbating diseases include diabetes. He has no history of chronic renal disease, liver disease or obesity. Pertinent negatives include no chest pain, myalgias or shortness of breath. The current treatment provides moderate improvement of lipids. There are no compliance problems.   Diabetes He presents for his follow-up diabetic visit. Pertinent negatives for hypoglycemia include no dizziness, headaches, nervousness/anxiousness or sweats. Pertinent negatives for diabetes include no blurred vision, no chest pain, no polydipsia and no polyuria. Pertinent negatives for diabetic complications include no CVA, PVD or retinopathy.  Benign Prostatic Hypertrophy This is a new problem. The current episode started more than 1 month ago. The problem has been waxing and waning since onset. Irritative symptoms include frequency and nocturia.  Irritative symptoms do not include urgency. Obstructive symptoms include incomplete emptying, an intermittent stream, a slower stream and a weak stream. Pertinent negatives include no chills, dysuria, hematuria or nausea.    Lab Results  Component Value Date   NA 138 01/31/2022   K 4.5 01/31/2022   CO2 22 01/31/2022   GLUCOSE 98 01/31/2022   BUN 13 01/31/2022   CREATININE 1.14 01/31/2022   CALCIUM 10.2 01/31/2022   EGFR 74 01/31/2022   GFRNONAA >60 07/10/2020   Lab Results  Component Value Date   CHOL 164 01/31/2022   HDL 36 (L) 01/31/2022   LDLCALC 106 (H) 01/31/2022   TRIG 119 01/31/2022   No results found for: "TSH" Lab Results  Component Value Date   HGBA1C 6.7 (H) 07/11/2022   No results found for: "WBC", "HGB", "HCT", "MCV", "PLT" Lab Results  Component Value Date   ALT 19 01/31/2022   AST 37 01/31/2022   ALKPHOS 128 (H) 01/31/2022   BILITOT 0.4 01/31/2022   No results found for: "25OHVITD2", "25OHVITD3", "VD25OH"   Review of Systems  Constitutional:  Negative for chills, fever and malaise/fatigue.  HENT:  Negative for drooling, ear discharge, ear pain and sore throat.   Eyes:  Negative for blurred vision.  Respiratory:  Negative for cough, shortness of breath and wheezing.   Cardiovascular:  Negative for chest pain, palpitations, orthopnea, leg swelling and PND.  Gastrointestinal:  Negative for abdominal pain, blood in stool, constipation, diarrhea and nausea.  Endocrine: Negative for polydipsia and polyuria.  Genitourinary:  Positive for decreased urine volume, difficulty urinating, frequency, incomplete emptying and nocturia. Negative for dysuria, hematuria and urgency.  Musculoskeletal:  Negative for back pain, myalgias and neck pain.  Skin:  Negative for rash.  Allergic/Immunologic: Negative for environmental allergies.  Neurological:  Negative for dizziness  and headaches.  Hematological:  Does not bruise/bleed easily.  Psychiatric/Behavioral:  Negative  for suicidal ideas. The patient is not nervous/anxious.     Patient Active Problem List   Diagnosis Date Noted   Paraseptal emphysema (HCC) 10/17/2022   Type 2 diabetes mellitus with hyperglycemia, without long-term current use of insulin ([facility]CC) 12/07/2020   Essential hypertension 05/14/2018   Cigarette nicotine dependence, uncomplicated 05/14/2018   History of stomach ulcers 05/14/2018   Benign prostatic hyperplasia with urinary obstruction 11/29/2017   Sleep apnea 07/25/2017    No Known Allergies  Past Surgical History:  Procedure Laterality Date   COLONOSCOPY     TOOTH EXTRACTION      Social History   Tobacco Use   Smoking status: Every Day    Packs/day: 0.75    Years: 31.00    Additional pack years: 0.00    Total pack years: 23.25    Types: Cigarettes   Smokeless tobacco: Never  Substance Use Topics   Alcohol use: Yes    Comment: occasion   Drug use: Never     Medication list has been reviewed and updated.  Current Meds  Medication Sig   albuterol (VENTOLIN HFA) 108 (90 Base) MCG/ACT inhaler Inhale 2 puffs into the lungs every 4 (four) hours as needed.   amLODipine-benazepril (LOTREL) 10-40 MG capsule Take 1 capsule by mouth daily.   atorvastatin (LIPITOR) 40 MG tablet Take 1 tablet (40 mg total) by mouth daily.   glucose blood test strip Check your sugar in the morning before you eat breakfast, and one hour after a meal.   glucose monitoring kit (FREESTYLE) monitoring kit 1 each by Does not apply route daily. Check glucose once in the morning before breakfast and 1 hour after a meal   ipratropium (ATROVENT) 0.06 % nasal spray Place 2 sprays into both nostrils 4 (four) times daily.   Spacer/Aero-Holding Chambers (AEROCHAMBER MV) inhaler Use as instructed   SYNJARDY XR 12.11-998 MG TB24 Take 2 tablets by mouth every morning. Dr Gershon Crane       10/17/2022    9:52 AM 07/11/2022   10:17 AM 04/04/2022   11:41 AM 01/31/2022    9:00 AM  GAD 7 : Generalized Anxiety  Score  Nervous, Anxious, on Edge 0 0 0 0  Control/stop worrying 0 0 0 0  Worry too much - different things 0 0 0 1  Trouble relaxing 0 0 0 1  Restless 0 0 0 1  Easily annoyed or irritable 0 0 0 0  Afraid - awful might happen 0 0 0 0  Total GAD 7 Score 0 0 0 3  Anxiety Difficulty Not difficult at all Not difficult at all Not difficult at all Not difficult at all       10/17/2022    9:52 AM 07/11/2022   10:16 AM 04/04/2022   11:41 AM  Depression screen PHQ 2/9  Decreased Interest 0 0 0  Down, Depressed, Hopeless 0 0 0  PHQ - 2 Score 0 0 0  Altered sleeping 0 0 0  Tired, decreased energy 0 0 0  Change in appetite 0 0 0  Feeling bad or failure about yourself  0 0 0  Trouble concentrating 0 0 0  Moving slowly or fidgety/restless 0 0 0  Suicidal thoughts 0 0 0  PHQ-9 Score 0 0 0  Difficult doing work/chores  Not difficult at all Not difficult at all    BP Readings from Last 3 Encounters:  01/06/23 118/64  10/17/22 120/70  09/18/22 132/84    Physical Exam Vitals and nursing note reviewed.  HENT:     Head: Normocephalic.     Right Ear: Tympanic membrane and external ear normal.     Left Ear: Tympanic membrane and external ear normal.     Nose: Nose normal. No congestion or rhinorrhea.     Mouth/Throat:     Pharynx: No oropharyngeal exudate or posterior oropharyngeal erythema.  Eyes:     General: No scleral icterus.       Right eye: No discharge.        Left eye: No discharge.     Conjunctiva/sclera: Conjunctivae normal.     Pupils: Pupils are equal, round, and reactive to light.  Neck:     Thyroid: No thyromegaly.     Vascular: No JVD.     Trachea: No tracheal deviation.  Cardiovascular:     Rate and Rhythm: Normal rate and regular rhythm.     Heart sounds: Normal heart sounds. No murmur heard.    No friction rub. No gallop.  Pulmonary:     Effort: No respiratory distress.     Breath sounds: Normal breath sounds. No wheezing or rales.  Abdominal:     General:  Bowel sounds are normal.     Palpations: Abdomen is soft. There is no mass.     Tenderness: There is no abdominal tenderness. There is no guarding or rebound.  Genitourinary:    Prostate: Enlarged and tender. No nodules present.     Rectum: Normal. Guaiac result negative. No mass or tenderness.     Comments: R>L Musculoskeletal:        General: No tenderness. Normal range of motion.     Cervical back: Normal range of motion and neck supple.  Lymphadenopathy:     Cervical: No cervical adenopathy.  Skin:    General: Skin is warm.     Findings: No rash.  Neurological:     Mental Status: He is alert and oriented to person, place, and time.     Cranial Nerves: No cranial nerve deficit.     Deep Tendon Reflexes: Reflexes are normal and symmetric.     Wt Readings from Last 3 Encounters:  01/06/23 214 lb (97.1 kg)  10/17/22 213 lb (96.6 kg)  10/03/22 218 lb (98.9 kg)    BP 118/64   Pulse 90   Ht 5\' 11"  (1.803 m)   Wt 214 lb (97.1 kg)   SpO2 98%   BMI 29.85 kg/m   Assessment and Plan: 1. Primary hypertension Chronic.  Controlled.  Stable.  Blood pressure 118/64.  Asymptomatic.  Tolerating medication well.  Continue amlodipine benazepril 10-40 mg once a day.  Will check CMP for electrolytes and GFR. - amLODipine-benazepril (LOTREL) 10-40 MG capsule; Take 1 capsule by mouth daily.  Dispense: 90 capsule; Refill: 1 - Comprehensive Metabolic Panel (CMET)  2. Moderate mixed hyperlipidemia not requiring statin therapy Chronic.  Controlled.  Stable.  Continue atorvastatin 40 mg once a day.  Will check lipid panel for current status of lipid control. - atorvastatin (LIPITOR) 40 MG tablet; Take 1 tablet (40 mg total) by mouth daily.  Dispense: 90 tablet; Refill: 1 - Lipid Panel With LDL/HDL Ratio  3. Benign prostatic hyperplasia with incomplete bladder emptying New onset.  Episodic hesitancy with decreased stream for the past couple months.  Examination of the prostate notes an enlarged  right lobe relative to the left without nodularity.  Will initiate Flomax 0.4 once  a day and refer to urology for further eval. - tamsulosin (FLOMAX) 0.4 MG CAPS capsule; Take 1 capsule (0.4 mg total) by mouth daily.  Dispense: 30 capsule; Refill: 3 - Ambulatory referral to Urology  4. Type 2 diabetes mellitus with hyperglycemia, without long-term current use of insulin (HCC) Biometric screening requires A1c and we will obtain that at this time. - HgB A1c  5. Cigarette nicotine dependence, uncomplicated Patient has been advised of the health risks of smoking and counseled concerning cessation of tobacco products. I spent over 3 minutes for discussion and to answer questions.   6. Colon cancer screening Discussed with patient and will obtain referral to gastroenterology for colon cancer screening. - Ambulatory referral to Gastroenterology     Elizabeth Sauer, MD

## 2023-01-07 LAB — COMPREHENSIVE METABOLIC PANEL
ALT: 15 IU/L (ref 0–44)
AST: 34 IU/L (ref 0–40)
Albumin: 4.5 g/dL (ref 3.8–4.9)
Alkaline Phosphatase: 142 IU/L — ABNORMAL HIGH (ref 44–121)
BUN/Creatinine Ratio: 11 (ref 10–24)
BUN: 12 mg/dL (ref 8–27)
Bilirubin Total: 0.4 mg/dL (ref 0.0–1.2)
CO2: 22 mmol/L (ref 20–29)
Calcium: 10 mg/dL (ref 8.6–10.2)
Chloride: 100 mmol/L (ref 96–106)
Creatinine, Ser: 1.1 mg/dL (ref 0.76–1.27)
Globulin, Total: 2.8 g/dL (ref 1.5–4.5)
Glucose: 169 mg/dL — ABNORMAL HIGH (ref 70–99)
Potassium: 4.3 mmol/L (ref 3.5–5.2)
Sodium: 138 mmol/L (ref 134–144)
Total Protein: 7.3 g/dL (ref 6.0–8.5)
eGFR: 77 mL/min/{1.73_m2} (ref >59)

## 2023-01-07 LAB — LIPID PANEL WITH LDL/HDL RATIO
Cholesterol, Total: 169 mg/dL (ref 100–199)
HDL: 34 mg/dL — ABNORMAL LOW (ref >39)
LDL Chol Calc (NIH): 113 mg/dL — ABNORMAL HIGH (ref 0–99)
LDL/HDL Ratio: 3.3 ratio (ref 0.0–3.6)
Triglycerides: 121 mg/dL (ref 0–149)
VLDL Cholesterol Cal: 22 mg/dL (ref 5–40)

## 2023-01-07 LAB — HEMOGLOBIN A1C
Est. average glucose Bld gHb Est-mCnc: 157 mg/dL
Hgb A1c MFr Bld: 7.1 % — ABNORMAL HIGH (ref 4.8–5.6)

## 2023-01-07 LAB — PSA: Prostate Specific Ag, Serum: 2.6 ng/mL (ref 0.0–4.0)

## 2023-01-10 ENCOUNTER — Telehealth: Payer: Self-pay

## 2023-01-10 ENCOUNTER — Other Ambulatory Visit: Payer: Self-pay

## 2023-01-10 DIAGNOSIS — Z1211 Encounter for screening for malignant neoplasm of colon: Secondary | ICD-10-CM

## 2023-01-10 MED ORDER — NA SULFATE-K SULFATE-MG SULF 17.5-3.13-1.6 GM/177ML PO SOLN: 1.0000 | Freq: Once | ORAL | 0 refills | Status: AC

## 2023-01-10 NOTE — Telephone Encounter (Signed)
Gastroenterology Pre-Procedure Review  Request Date: 03/06/23 Requesting Physician: Dr. Servando Snare  PATIENT REVIEW QUESTIONS: The patient responded to the following health history questions as indicated:    1. Are you having any GI issues? no 2. Do you have a personal history of Polyps? no 3. Do you have a family history of Colon Cancer or Polyps? no 4. Diabetes Mellitus? yes (Synjardy ) 5. Joint replacements in the past 12 months?no 6. Major health problems in the past 3 months?no 7. Any artificial heart valves, MVP, or defibrillator?no    MEDICATIONS & ALLERGIES:    Patient reports the following regarding taking any anticoagulation/antiplatelet therapy:   Plavix, Coumadin, Eliquis, Xarelto, Lovenox, Pradaxa, Brilinta, or Effient? no Aspirin? no  Patient confirms/reports the following medications:  Current Outpatient Medications  Medication Sig Dispense Refill   albuterol (VENTOLIN HFA) 108 (90 Base) MCG/ACT inhaler Inhale 2 puffs into the lungs every 4 (four) hours as needed. 18 g 0   amLODipine-benazepril (LOTREL) 10-40 MG capsule Take 1 capsule by mouth daily. 90 capsule 1   atorvastatin (LIPITOR) 40 MG tablet Take 1 tablet (40 mg total) by mouth daily. 90 tablet 1   glucose blood test strip Check your sugar in the morning before you eat breakfast, and one hour after a meal. 100 each 2   glucose monitoring kit (FREESTYLE) monitoring kit 1 each by Does not apply route daily. Check glucose once in the morning before breakfast and 1 hour after a meal 1 each 0   ipratropium (ATROVENT) 0.06 % nasal spray Place 2 sprays into both nostrils 4 (four) times daily. 15 mL 12   Spacer/Aero-Holding Chambers (AEROCHAMBER MV) inhaler Use as instructed 1 each 2   SYNJARDY XR 12.11-998 MG TB24 Take 2 tablets by mouth every morning. Dr Gershon Crane     tamsulosin (FLOMAX) 0.4 MG CAPS capsule Take 1 capsule (0.4 mg total) by mouth daily. 30 capsule 3   No current facility-administered medications for this  visit.    Patient confirms/reports the following allergies:  No Known Allergies  No orders of the defined types were placed in this encounter.   AUTHORIZATION INFORMATION Primary Insurance: 1D#: Group #:  Secondary Insurance: 1D#: Group #:  SCHEDULE INFORMATION: Date: 03/06/23 Time: Location: [facility]

## 2023-01-10 NOTE — Telephone Encounter (Signed)
Colonoscopy Instructions revised for Cory Thompson stop date.  Thanks, [place], [place]

## 2023-01-20 ENCOUNTER — Telehealth: Payer: Self-pay

## 2023-01-20 ENCOUNTER — Telehealth: Payer: Self-pay | Admitting: Family Medicine

## 2023-01-20 NOTE — Telephone Encounter (Signed)
Spoke to [place]- [phone] with [employer]- gave her the A1C results from 2 weeks ago on the phone. She stated I didn't need to fax over results as long as they were given to her on the phone. I called pt back and gave him the information I was given.

## 2023-01-20 NOTE — Telephone Encounter (Signed)
Copied from CRM [id number]. 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  FAX [phone]

## 2023-01-29 ENCOUNTER — Other Ambulatory Visit: Payer: Self-pay | Admitting: Family Medicine

## 2023-01-29 DIAGNOSIS — J438 Other emphysema: Secondary | ICD-10-CM

## 2023-01-29 DIAGNOSIS — N401 Enlarged prostate with lower urinary tract symptoms: Secondary | ICD-10-CM

## 2023-02-21 NOTE — Anesthesia Preprocedure Evaluation (Addendum)
Anesthesia Evaluation  Patient identified by MRN, date of birth, ID band Patient awake    Reviewed: Allergy & Precautions, H&P , NPO status , Patient's Chart, lab work & pertinent test results  Airway Mallampati: II  TM Distance: >3 FB Neck ROM: Full    Dental no notable dental hx. (+) Lower Dentures, Upper Dentures   Pulmonary neg pulmonary ROS, sleep apnea , COPD, Current Smoker  Will give breathing treatment pre-procedure  breath sounds clear to auscultation + wheezing      Cardiovascular hypertension, negative cardio ROS Normal cardiovascular exam Rhythm:Regular Rate:Normal     Neuro/Psych negative neurological ROS  negative psych ROS   GI/Hepatic negative GI ROS, Neg liver ROS,GERD  ,,  Endo/Other  negative endocrine ROSdiabetes    Renal/GU negative Renal ROS  negative genitourinary   Musculoskeletal negative musculoskeletal ROS (+)    Abdominal   Peds negative pediatric ROS (+)  Hematology negative hematology ROS (+)   Anesthesia Other Findings Hypertension  Pre-diabetes Diabetes mellitus without complication COPD     Reproductive/Obstetrics negative OB ROS                             Anesthesia Physical Anesthesia Plan  ASA: 3  Anesthesia Plan: General   Post-op Pain Management:    Induction: Intravenous  PONV Risk Score and Plan:   Airway Management Planned: Natural Airway and Nasal Cannula  Additional Equipment:   Intra-op Plan:   Post-operative Plan:   Informed Consent: I have reviewed the patients History and Physical, chart, labs and discussed the procedure including the risks, benefits and alternatives for the proposed anesthesia with the patient or authorized representative who has indicated his/her understanding and acceptance.     Dental Advisory Given  Plan Discussed with: Anesthesiologist, CRNA and Surgeon  Anesthesia Plan Comments: (Patient  consented for risks of anesthesia including but not limited to:  - adverse reactions to medications - risk of airway placement if required - damage to eyes, teeth, lips or other oral mucosa - nerve damage due to positioning  - sore throat or hoarseness - Damage to heart, brain, nerves, lungs, other parts of body or loss of life  Patient voiced understanding.)       Anesthesia Quick Evaluation

## 2023-02-27 ENCOUNTER — Encounter: Payer: Self-pay | Admitting: Gastroenterology

## 2023-02-27 ENCOUNTER — Other Ambulatory Visit: Payer: Self-pay

## 2023-03-06 ENCOUNTER — Ambulatory Visit: Payer: BC Managed Care – PPO | Admitting: Anesthesiology

## 2023-03-06 ENCOUNTER — Other Ambulatory Visit: Payer: Self-pay

## 2023-03-06 ENCOUNTER — Encounter: Payer: Self-pay | Admitting: Gastroenterology

## 2023-03-06 ENCOUNTER — Encounter
Admission: RE | Disposition: A | Discharge status: Home / Self Care | Payer: Self-pay | Source: Home / Self Care | Attending: Gastroenterology

## 2023-03-06 ENCOUNTER — Ambulatory Visit
Admission: RE | Admit: 2023-03-06 | Discharge: 2023-03-06 | Disposition: A | Discharge status: Home / Self Care | Payer: BC Managed Care – PPO | Attending: Gastroenterology | Admitting: Gastroenterology

## 2023-03-06 DIAGNOSIS — I10 Essential (primary) hypertension: Secondary | ICD-10-CM | POA: Diagnosis not present

## 2023-03-06 DIAGNOSIS — K5730 Diverticulosis of large intestine without perforation or abscess without bleeding: Secondary | ICD-10-CM | POA: Insufficient documentation

## 2023-03-06 DIAGNOSIS — F17210 Nicotine dependence, cigarettes, uncomplicated: Secondary | ICD-10-CM | POA: Insufficient documentation

## 2023-03-06 DIAGNOSIS — K640 First degree hemorrhoids: Secondary | ICD-10-CM | POA: Diagnosis not present

## 2023-03-06 DIAGNOSIS — Z1211 Encounter for screening for malignant neoplasm of colon: Secondary | ICD-10-CM | POA: Diagnosis not present

## 2023-03-06 DIAGNOSIS — D123 Benign neoplasm of transverse colon: Secondary | ICD-10-CM | POA: Diagnosis not present

## 2023-03-06 DIAGNOSIS — K635 Polyp of colon: Secondary | ICD-10-CM | POA: Diagnosis not present

## 2023-03-06 HISTORY — DX: Complete loss of teeth, unspecified cause, unspecified class: K08.109

## 2023-03-06 HISTORY — DX: Encounter for screening for malignant neoplasm of colon: Z12.11

## 2023-03-06 HISTORY — PX: POLYPECTOMY: SHX5525

## 2023-03-06 HISTORY — PX: COLONOSCOPY WITH PROPOFOL: SHX5780

## 2023-03-06 HISTORY — DX: Gastro-esophageal reflux disease without esophagitis: K21.9

## 2023-03-06 HISTORY — DX: Sleep apnea, unspecified: G47.30

## 2023-03-06 HISTORY — DX: Dislocation of jaw, unspecified side, initial encounter: S03.00XA

## 2023-03-06 LAB — GLUCOSE, CAPILLARY: Glucose-Capillary: 124 mg/dL — ABNORMAL HIGH (ref 70–99)

## 2023-03-06 SURGERY — COLONOSCOPY WITH PROPOFOL
Anesthesia: General | Site: Rectum

## 2023-03-06 MED ORDER — SODIUM CHLORIDE 0.9 % IV SOLN: INTRAVENOUS | Status: DC

## 2023-03-06 MED ORDER — STERILE WATER FOR IRRIGATION IR SOLN
Status: DC | PRN
  Administered 2023-03-06 (×2): 60 mL

## 2023-03-06 MED ORDER — LIDOCAINE HCL (CARDIAC) PF 100 MG/5ML IV SOSY
PREFILLED_SYRINGE | INTRAVENOUS | Status: DC | PRN
  Administered 2023-03-06: 100 mg via INTRAVENOUS

## 2023-03-06 MED ORDER — IPRATROPIUM-ALBUTEROL 0.5-2.5 (3) MG/3ML IN SOLN
3.0000 mL | Freq: Once | RESPIRATORY_TRACT | Status: AC
  Administered 2023-03-06: 3 mL via RESPIRATORY_TRACT

## 2023-03-06 MED ORDER — LACTATED RINGERS IV SOLN: INTRAVENOUS | Status: DC

## 2023-03-06 MED ORDER — STERILE WATER FOR IRRIGATION IR SOLN
Status: DC | PRN
  Administered 2023-03-06: 1

## 2023-03-06 MED ORDER — PROPOFOL 10 MG/ML IV BOLUS
INTRAVENOUS | Status: DC | PRN
Start: 2023-03-06 — End: 2023-03-06
  Administered 2023-03-06: 20 mg via INTRAVENOUS
  Administered 2023-03-06: 30 mg via INTRAVENOUS
  Administered 2023-03-06 (×3): 20 mg via INTRAVENOUS
  Administered 2023-03-06: 90 mg via INTRAVENOUS

## 2023-03-06 SURGICAL SUPPLY — 21 items

## 2023-03-06 NOTE — Op Note (Signed)
[facility] Gastroenterology Patient Name: Cory Thompson Procedure Date: 03/06/2023 7:53 AM MRN: [id number] Account #: [id number] Date of Birth: Sep 29, 1960 Admit Type: Outpatient Age: 62 Room: [facility] OR ROOM 01 Gender: Male Note Status: Finalized Instrument Name: [id number] Procedure:             Colonoscopy Indications:           Screening for colorectal malignant neoplasm Providers:             Midge Minium MD, MD Referring MD:          Duanne Limerick, MD (Referring MD) Medicines:             Propofol per Anesthesia Complications:         No immediate complications. Procedure:             Pre-Anesthesia Assessment:                        - Prior to the procedure, a History and Physical was                         performed, and patient medications and allergies were                         reviewed. The patient's tolerance of previous                         anesthesia was also reviewed. The risks and benefits                         of the procedure and the sedation options and risks                         were discussed with the patient. All questions were                         answered, and informed consent was obtained. Prior                         Anticoagulants: The patient has taken no anticoagulant                         or antiplatelet agents. ASA Grade Assessment: II - A                         patient with mild systemic disease. After reviewing                         the risks and benefits, the patient was deemed in                         satisfactory condition to undergo the procedure.                        After obtaining informed consent, the colonoscope was                         passed under direct vision. Throughout the procedure,  the patient's blood pressure, pulse, and oxygen                         saturations were monitored continuously. The                         Colonoscope was introduced through the anus and                          advanced to the the cecum, identified by appendiceal                         orifice and ileocecal valve. The colonoscopy was                         performed without difficulty. The patient tolerated                         the procedure well. The quality of the bowel                         preparation was excellent. Findings:      The perianal and digital rectal examinations were normal.      A 4 mm polyp was found in the transverse colon. The polyp was sessile.       The polyp was removed with a cold snare. Resection and retrieval were       complete.      Multiple small-mouthed diverticula were found in the sigmoid colon.      Non-bleeding internal hemorrhoids were found during retroflexion. The       hemorrhoids were Grade I (internal hemorrhoids that do not prolapse). Impression:            - One 4 mm polyp in the transverse colon, removed with                         a cold snare. Resected and retrieved.                        - Diverticulosis in the sigmoid colon.                        - Non-bleeding internal hemorrhoids. Recommendation:        - Discharge patient to home.                        - Resume previous diet.                        - Continue present medications.                        - Await pathology results.                        - If the pathology report reveals adenomatous tissue,                         then repeat the colonoscopy for surveillance in 7  years. Procedure Code(s):     --- Professional ---                        [phone], Colonoscopy, flexible; with removal of                         tumor(s), polyp(s), or other lesion(s) by snare                         technique Diagnosis Code(s):     --- Professional ---                        Z12.11, Encounter for screening for malignant neoplasm                         of colon                        D12.3, Benign neoplasm of transverse colon (hepatic                          flexure or splenic flexure) CPT copyright 2022 American Medical Association. All rights reserved. The codes documented in this report are preliminary and upon coder review may  be revised to meet current compliance requirements. Midge Minium MD, MD 03/06/2023 8:16:37 AM This report has been signed electronically. Number of Addenda: 0 Note Initiated On: 03/06/2023 7:53 AM Scope Withdrawal Time: 0 hours 9 minutes 36 seconds  Total Procedure Duration: 0 hours 12 minutes 53 seconds  Estimated Blood Loss:  Estimated blood loss: none.      [facility]

## 2023-03-06 NOTE — Anesthesia Postprocedure Evaluation (Signed)
Anesthesia Post Note  Patient: Cory Thompson  Procedure(s) Performed: COLONOSCOPY WITH BIOPSY (Rectum) POLYPECTOMY  Patient location during evaluation: PACU Anesthesia Type: General Level of consciousness: awake and alert Pain management: pain level controlled Vital Signs Assessment: post-procedure vital signs reviewed and stable Respiratory status: spontaneous breathing, nonlabored ventilation, respiratory function stable and patient connected to nasal cannula oxygen Cardiovascular status: blood pressure returned to baseline and stable Postop Assessment: no apparent nausea or vomiting Anesthetic complications: no   No notable events documented.   Last Vitals:  Vitals:   03/06/23 0818 03/06/23 0826  BP: 125/74 128/75  Pulse: 88 83  Resp: (!) 24 (!) 22  Temp: 37.1 C 37.1 C  SpO2: 95% 94%    Last Pain:  Vitals:   03/06/23 0826  TempSrc:   PainSc: 0-No pain                 Marisue Humble

## 2023-03-06 NOTE — Transfer of Care (Signed)
Immediate Anesthesia Transfer of Care Note  Patient: Cory Thompson  Procedure(s) Performed: COLONOSCOPY WITH BIOPSY (Rectum) POLYPECTOMY  Patient Location: PACU  Anesthesia Type: General  Level of Consciousness: awake, alert  and patient cooperative  Airway and Oxygen Therapy: Patient Spontanous Breathing and Patient connected to supplemental oxygen  Post-op Assessment: Post-op Vital signs reviewed, Patient's Cardiovascular Status Stable, Respiratory Function Stable, Patent Airway and No signs of Nausea or vomiting  Post-op Vital Signs: Reviewed and stable  Complications: No notable events documented.

## 2023-03-06 NOTE — H&P (Signed)
Cory Minium, MD [facility] [address] [place], [place] [address] Phone: [phone] Fax : [phone]  Primary Care Physician:  Duanne Limerick, MD Primary Gastroenterologist:  Dr. Servando Snare  Pre-Procedure History & Physical: HPI:  Cory Thompson is a 62 y.o. male is here for a screening colonoscopy.   Past Medical History:  Diagnosis Date   COPD (chronic obstructive pulmonary disease) (HCC)    Diabetes mellitus without complication (HCC)    Full dentures    Top and Bottom   GERD (gastroesophageal reflux disease)    Hypertension    Sleep apnea    no CPAP   TMJ (dislocation of temporomandibular joint)     Past Surgical History:  Procedure Laterality Date   TOOTH EXTRACTION      Prior to Admission medications   Medication Sig Start Date End Date Taking? Authorizing Provider  albuterol (VENTOLIN HFA) 108 (90 Base) MCG/ACT inhaler TAKE 2 PUFFS BY MOUTH EVERY 4 HOURS AS NEEDED 01/30/23  Yes Duanne Limerick, MD  amLODipine-benazepril (LOTREL) 10-40 MG capsule Take 1 capsule by mouth daily. 01/06/23  Yes Duanne Limerick, MD  glucose blood test strip Check your sugar in the morning before you eat breakfast, and one hour after a meal. 06/06/20  Yes Domenick Gong, MD  glucose monitoring kit (FREESTYLE) monitoring kit 1 each by Does not apply route daily. Check glucose once in the morning before breakfast and 1 hour after a meal 06/06/20  Yes Domenick Gong, MD  Spacer/Aero-Holding Chambers (AEROCHAMBER MV) inhaler Use as instructed 09/18/22  Yes Becky Augusta, NP  SYNJARDY XR 12.11-998 MG TB24 Take 2 tablets by mouth every morning. Dr Gershon Crane 08/16/21  Yes [provider]  atorvastatin (LIPITOR) 40 MG tablet Take 1 tablet (40 mg total) by mouth daily. Patient not taking: Reported on 02/27/2023 01/06/23   Duanne Limerick, MD  ipratropium (ATROVENT) 0.06 % nasal spray Place 2 sprays into both nostrils 4 (four) times daily. Patient taking differently: Place 2 sprays into  both nostrils as needed. 09/18/22   Becky Augusta, NP  tamsulosin (FLOMAX) 0.4 MG CAPS capsule TAKE 1 CAPSULE BY MOUTH EVERY DAY Patient taking differently: Take 0.4 mg by mouth as needed. 01/30/23   Duanne Limerick, MD    Allergies as of 01/10/2023   (No Known Allergies)    Family History  Problem Relation Age of Onset   Diabetes Mother    Hypertension Mother    Hypertension Father     Social History   Socioeconomic History   Marital status: Single    Spouse name: Not on file   Number of children: Not on file   Years of education: Not on file   Highest education level: Not on file  Occupational History   Not on file  Tobacco Use   Smoking status: Every Day    Current packs/day: 0.50    Average packs/day: 0.6 packs/day for 60.6 years (38.1 ttl pk-yrs)    Types: Cigarettes    Start date: 1995   Smokeless tobacco: Never  Vaping Use   Vaping status: Not on file  Substance and Sexual Activity   Alcohol use: Yes    Comment: socially, every couple of weeks   Drug use: Never   Sexual activity: Not Currently  Other Topics Concern   Not on file  Social History Narrative   Not on file   Social Determinants of Health   Financial Resource Strain: Not on file  Food Insecurity: Not on file  Transportation Needs: Not on file  Physical Activity: Not on file  Stress: Not on file  Social Connections: Not on file  Intimate Partner Violence: Not on file    Review of Systems: See HPI, otherwise negative ROS  Physical Exam: BP (!) 142/84   Pulse 87   Temp 99.3 F (37.4 C) (Temporal)   Resp 18   Ht 5\' 11"  (1.803 m)   Wt 94.9 kg   SpO2 96%   BMI 29.18 kg/m  General:   Alert,  pleasant and cooperative in NAD Head:  Normocephalic and atraumatic. Neck:  Supple; no masses or thyromegaly. Lungs:  Clear throughout to auscultation.    Heart:  Regular rate and rhythm. Abdomen:  Soft, nontender and nondistended. Normal bowel sounds, without guarding, and without rebound.    Neurologic:  Alert and  oriented x4;  grossly normal neurologically.  Impression/Plan: Cory Thompson is now here to undergo a screening colonoscopy.  Risks, benefits, and alternatives regarding colonoscopy have been reviewed with the patient.  Questions have been answered.  All parties agreeable.

## 2023-03-07 ENCOUNTER — Encounter: Payer: Self-pay | Admitting: Gastroenterology

## 2023-03-09 ENCOUNTER — Encounter: Payer: Self-pay | Admitting: Gastroenterology

## 2023-04-10 DIAGNOSIS — E785 Hyperlipidemia, unspecified: Secondary | ICD-10-CM | POA: Diagnosis not present

## 2023-04-10 DIAGNOSIS — E1159 Type 2 diabetes mellitus with other circulatory complications: Secondary | ICD-10-CM | POA: Diagnosis not present

## 2023-04-10 DIAGNOSIS — E1169 Type 2 diabetes mellitus with other specified complication: Secondary | ICD-10-CM | POA: Diagnosis not present

## 2023-04-10 DIAGNOSIS — E119 Type 2 diabetes mellitus without complications: Secondary | ICD-10-CM | POA: Diagnosis not present

## 2023-07-03 DIAGNOSIS — E785 Hyperlipidemia, unspecified: Secondary | ICD-10-CM | POA: Diagnosis not present

## 2023-07-03 DIAGNOSIS — E119 Type 2 diabetes mellitus without complications: Secondary | ICD-10-CM | POA: Diagnosis not present

## 2023-07-03 DIAGNOSIS — E1159 Type 2 diabetes mellitus with other circulatory complications: Secondary | ICD-10-CM | POA: Diagnosis not present

## 2023-07-03 DIAGNOSIS — E1169 Type 2 diabetes mellitus with other specified complication: Secondary | ICD-10-CM | POA: Diagnosis not present

## 2023-07-05 ENCOUNTER — Other Ambulatory Visit: Payer: Self-pay | Admitting: Family Medicine

## 2023-07-05 DIAGNOSIS — I10 Essential (primary) hypertension: Secondary | ICD-10-CM

## 2023-07-10 DIAGNOSIS — E119 Type 2 diabetes mellitus without complications: Secondary | ICD-10-CM | POA: Diagnosis not present

## 2023-07-21 ENCOUNTER — Ambulatory Visit
Admission: EM | Admit: 2023-07-21 | Discharge: 2023-07-21 | Disposition: A | Discharge status: Home / Self Care | Payer: BC Managed Care – PPO | Attending: Family Medicine | Admitting: Family Medicine

## 2023-07-21 ENCOUNTER — Encounter: Payer: Self-pay | Admitting: Emergency Medicine

## 2023-07-21 DIAGNOSIS — J019 Acute sinusitis, unspecified: Secondary | ICD-10-CM

## 2023-07-21 DIAGNOSIS — B9689 Other specified bacterial agents as the cause of diseases classified elsewhere: Secondary | ICD-10-CM | POA: Diagnosis not present

## 2023-07-21 MED ORDER — AMOXICILLIN-POT CLAVULANATE 875-125 MG PO TABS: 1.0000 | ORAL_TABLET | Freq: Two times a day (BID) | ORAL | 0 refills | Status: DC

## 2023-07-21 MED ORDER — PREDNISONE 50 MG PO TABS: 50.0000 mg | ORAL_TABLET | Freq: Every day | ORAL | 0 refills | Status: AC

## 2023-07-21 NOTE — ED Provider Notes (Signed)
[facility]    CSN: [id number] Arrival date & time: 07/21/23  1007      History   Chief Complaint Chief Complaint  Patient presents with   Nasal Congestion    HPI Cory Thompson is a 62 y.o. male.   HPI  History obtained from the patient. Cory Thompson presents for nasal congestion for 3 weeks. Using Afrin. He has not been able to breathe out of his nose for the past few days.  No headache. Has facial pain.  Has been sneezing. No coughing. His ears are popping.     Past Medical History:  Diagnosis Date   COPD (chronic obstructive pulmonary disease) (HCC)    Diabetes mellitus without complication (HCC)    Full dentures    Top and Bottom   GERD (gastroesophageal reflux disease)    Hypertension    Sleep apnea    no CPAP   TMJ (dislocation of temporomandibular joint)     Patient Active Problem List   Diagnosis Date Noted   Encounter for screening colonoscopy 03/06/2023   Polyp of transverse colon 03/06/2023   Paraseptal emphysema (HCC) 10/17/2022   Type 2 diabetes mellitus with hyperglycemia, without long-term current use of insulin (HCC) 12/07/2020   Essential hypertension 05/14/2018   Cigarette nicotine dependence, uncomplicated 05/14/2018   History of stomach ulcers 05/14/2018   Benign prostatic hyperplasia with urinary obstruction 11/29/2017   Sleep apnea 07/25/2017    Past Surgical History:  Procedure Laterality Date   COLONOSCOPY WITH PROPOFOL N/A 03/06/2023   Procedure: COLONOSCOPY WITH BIOPSY;  Surgeon: Midge Minium, MD;  Location: [facility];  Service: Endoscopy;  Laterality: N/A;   POLYPECTOMY N/A 03/06/2023   Procedure: POLYPECTOMY;  Surgeon: Midge Minium, MD;  Location: [facility];  Service: Endoscopy;  Laterality: N/A;   TOOTH EXTRACTION         Home Medications    Prior to Admission medications   Medication Sig Start Date End Date Taking? Authorizing Provider  amLODipine-benazepril (LOTREL) 10-40 MG capsule TAKE 1  CAPSULE BY MOUTH EVERY DAY 07/05/23  Yes Duanne Limerick, MD  amoxicillin-clavulanate (AUGMENTIN) 875-125 MG tablet Take 1 tablet by mouth every 12 (twelve) hours. 07/21/23  Yes Suzzanne Brunkhorst, DO  predniSONE (DELTASONE) 50 MG tablet Take 1 tablet (50 mg total) by mouth daily for 5 days. 07/21/23 07/26/23 Yes Kurt Hoffmeier, DO  SYNJARDY XR 12.11-998 MG TB24 Take 2 tablets by mouth every morning. Dr Gershon Crane 08/16/21  Yes [provider]  tamsulosin (FLOMAX) 0.4 MG CAPS capsule TAKE 1 CAPSULE BY MOUTH EVERY DAY Patient taking differently: Take 0.4 mg by mouth as needed. 01/30/23  Yes Duanne Limerick, MD  albuterol (VENTOLIN HFA) 108 (90 Base) MCG/ACT inhaler TAKE 2 PUFFS BY MOUTH EVERY 4 HOURS AS NEEDED 01/30/23   Duanne Limerick, MD  atorvastatin (LIPITOR) 40 MG tablet Take 1 tablet (40 mg total) by mouth daily. Patient not taking: Reported on 02/27/2023 01/06/23   Duanne Limerick, MD  glucose blood test strip Check your sugar in the morning before you eat breakfast, and one hour after a meal. 06/06/20   Domenick Gong, MD  glucose monitoring kit (FREESTYLE) monitoring kit 1 each by Does not apply route daily. Check glucose once in the morning before breakfast and 1 hour after a meal 06/06/20   Domenick Gong, MD  ipratropium (ATROVENT) 0.06 % nasal spray Place 2 sprays into both nostrils 4 (four) times daily. Patient taking differently: Place 2 sprays into both nostrils as  needed. 09/18/22   Becky Augusta, NP  Spacer/Aero-Holding Deretha Emory (AEROCHAMBER MV) inhaler Use as instructed 09/18/22   Becky Augusta, NP    Family History Family History  Problem Relation Age of Onset   Diabetes Mother    Hypertension Mother    Hypertension Father     Social History Social History   Tobacco Use   Smoking status: Every Day    Current packs/day: 0.50    Average packs/day: 0.6 packs/day for 61.0 years (38.2 ttl pk-yrs)    Types: Cigarettes    Start date: 1995   Smokeless tobacco: Never   Vaping Use   Vaping status: Never Used  Substance Use Topics   Alcohol use: Yes    Comment: socially, every couple of weeks   Drug use: Never     Allergies   Patient has no known allergies.   Review of Systems Review of Systems: negative unless otherwise stated in HPI.      Physical Exam Triage Vital Signs ED Triage Vitals  Encounter Vitals Group     BP 07/21/23 1113 125/70     Systolic BP Percentile --      Diastolic BP Percentile --      Pulse Rate 07/21/23 1113 71     Resp 07/21/23 1113 16     Temp 07/21/23 1113 97.7 F (36.5 C)     Temp Source 07/21/23 1113 Oral     SpO2 07/21/23 1113 95 %     Weight 07/21/23 1111 209 lb 3.5 oz (94.9 kg)     Height 07/21/23 1111 5\' 11"  (1.803 m)     Head Circumference --      Peak Flow --      Pain Score 07/21/23 1111 0     Pain Loc --      Pain Education --      Exclude from Growth Chart --    No data found.  Updated Vital Signs BP 125/70 (BP Location: Left Arm)   Pulse 71   Temp 97.7 F (36.5 C) (Oral)   Resp 16   Ht 5\' 11"  (1.803 m)   Wt 94.9 kg   SpO2 95%   BMI 29.18 kg/m   Visual Acuity Right Eye Distance:   Left Eye Distance:   Bilateral Distance:    Right Eye Near:   Left Eye Near:    Bilateral Near:     Physical Exam GEN:     alert, non-toxic appearing male in no distress    HENT:  mucus membranes moist, oropharyngeal without lesions or erythema,  moderate erythematous edematous turbinates, thick yellow-green nasal discharge, bilateral TM normal, no frontal sinus tenderness, bilateral maxillary sinus tenderness EYES:   pupils equal and reactive, no scleral injection or discharge NECK:  normal ROM, +lymphadenopathy, no meningismus   RESP:  no increased work of breathing, clear to auscultation bilaterally CVS:   regular rate and rhythm Skin:   warm and dry    UC Treatments / Results  Labs (all labs ordered are listed, but only abnormal results are displayed) Labs Reviewed - No data to  display  EKG   Radiology No results found.  Procedures Procedures (including critical care time)  Medications Ordered in UC Medications - No data to display  Initial Impression / Assessment and Plan / UC Course  I have reviewed the triage vital signs and the nursing notes.  Pertinent labs & imaging results that were available during my care of the patient were reviewed by me  and considered in my medical decision making (see chart for details).       Pt is a 62 y.o. male who presents for sinus congestion and pain for past 3 weeks.Flem is afebrile without recent use of antipyretics. Satting 95% on room air. Overall pt is well appearing, well hydrated, without respiratory distress. COVID and RSV testing deferred due to duration of symptoms.    Suspect bacterial rhinosinusitis given duration and maxillary sinus tenderness on exam.  Treat with Augmentin and prednisone as below.  Patient to monitor his blood sugars while taking prednisone.  Discussed typical duration of symptoms. Stop Afrin use. OTC symptom care as needed. Ensure adequate fluid intake and rest.    Reviewed expectations re: course of current medical issues. Questions answered. Return and ED precautions given.  Patient verbalized understanding. After Visit Summary given.  Discussed MDM, treatment plan and plan for follow-up with patient who agrees with plan.     Final Clinical Impressions(s) / UC Diagnoses   Final diagnoses:  Acute bacterial rhinosinusitis     Discharge Instructions      Stop by the pharmacy to pick up your prescriptions.  Follow up with your primary care provider as needed. Monitor your blood sugars while taking steroids.  Call your doctor if they are above 400.   Stop using the Afrin.      ED Prescriptions     Medication Sig Dispense Auth. Provider   amoxicillin-clavulanate (AUGMENTIN) 875-125 MG tablet Take 1 tablet by mouth every 12 (twelve) hours. 14 tablet Saraiah Bhat, DO    predniSONE (DELTASONE) 50 MG tablet Take 1 tablet (50 mg total) by mouth daily for 5 days. 3 tablet Katha Cabal, DO      PDMP not reviewed this encounter.   Katha Cabal, DO 07/21/23 1249

## 2023-07-21 NOTE — ED Triage Notes (Signed)
Patient reports nasal congestion and stuffy nose for over 2 weeks.  Patient reports using Afrin nasal spray off and on for over 2 weeks.  Patient denies fevers.

## 2023-07-21 NOTE — Discharge Instructions (Addendum)
Stop by the pharmacy to pick up your prescriptions.  Follow up with your primary care provider as needed. Monitor your blood sugars while taking steroids.  Call your doctor if they are above 400.   Stop using the Afrin.

## 2023-07-28 ENCOUNTER — Other Ambulatory Visit: Payer: Self-pay | Admitting: Family Medicine

## 2023-07-28 DIAGNOSIS — J438 Other emphysema: Secondary | ICD-10-CM

## 2023-07-28 DIAGNOSIS — N401 Enlarged prostate with lower urinary tract symptoms: Secondary | ICD-10-CM

## 2023-08-16 IMAGING — CT CT CHEST LUNG CANCER SCREENING LOW DOSE W/O CM
2 of 5 series · 14 of 40 positions shown, 17 images · non-contrast
Comparison: Chest CT 09/28/2020.

CLINICAL DATA: 61-year-old male current smoker with 24 pack-year
history of smoking. Lung cancer screening examination.



[Series 3: lung 1.00 · axial · 0.67mm/px · z∈[-1208,-913]mm · 11 of 325 slices shown, 14 images]
[im 15/325  mediastinal]
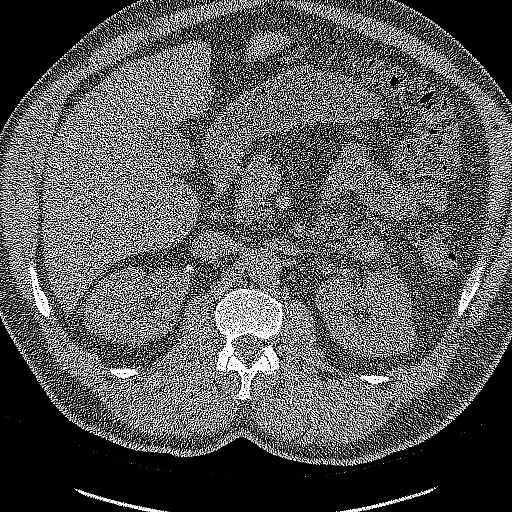
[im 15/325  lung]
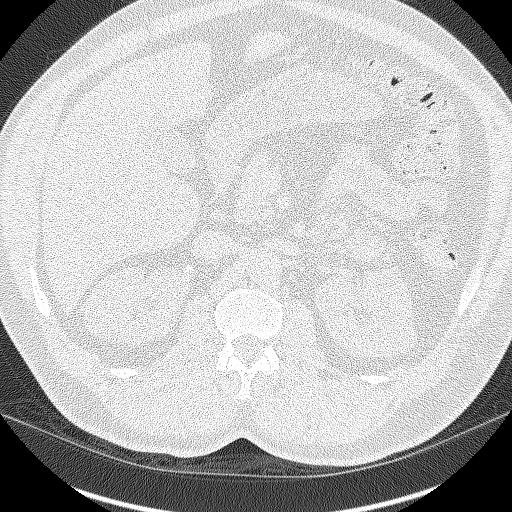
[im 45/325  lung]
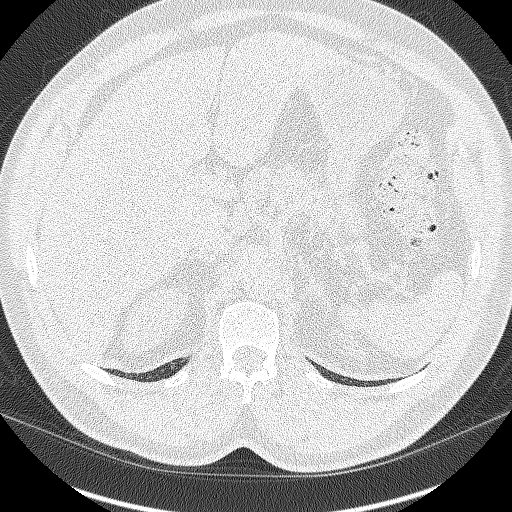
[im 74/325  lung]
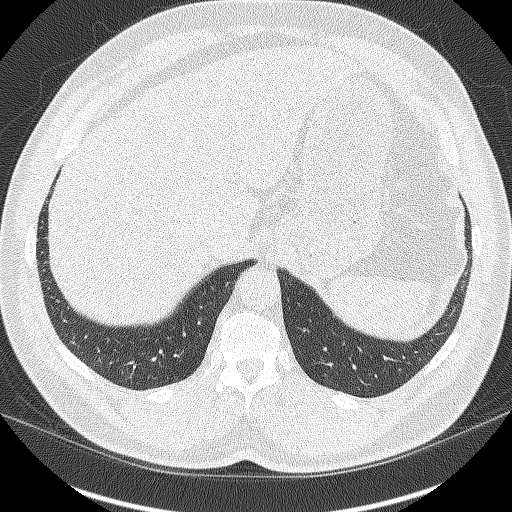
[im 104/325  lung]
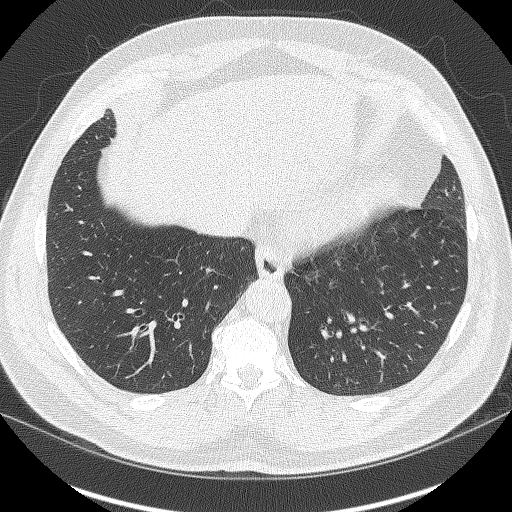
[im 133/325  mediastinal]
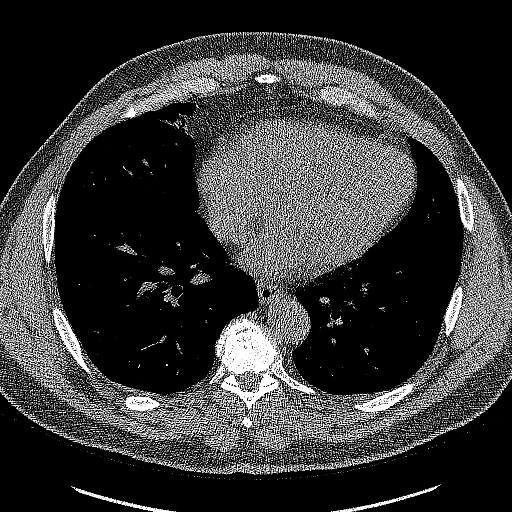
[im 133/325  lung]
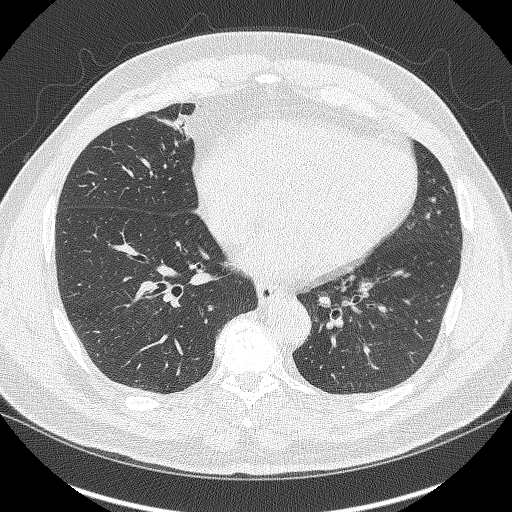
[im 163/325  lung]
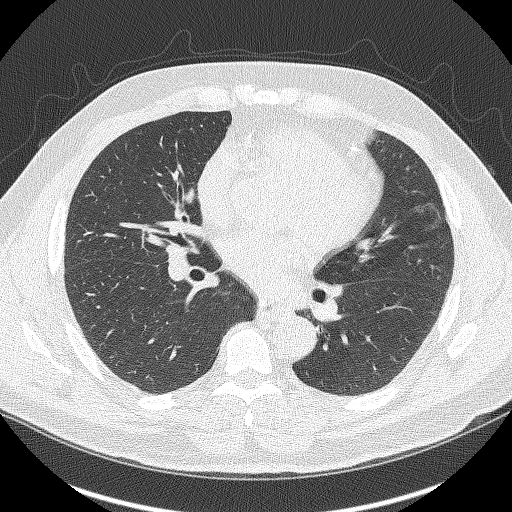
[im 192/325  lung]
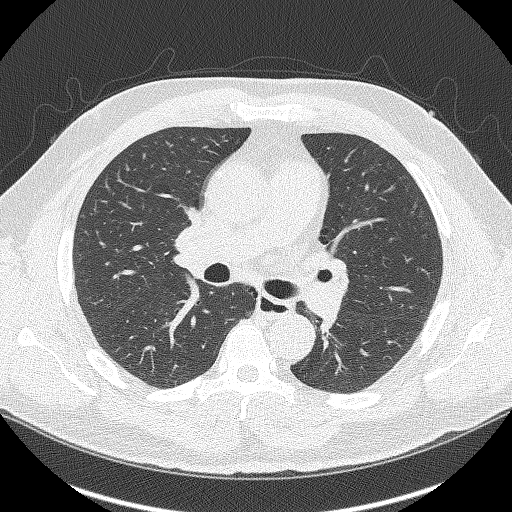
[im 221/325  lung]
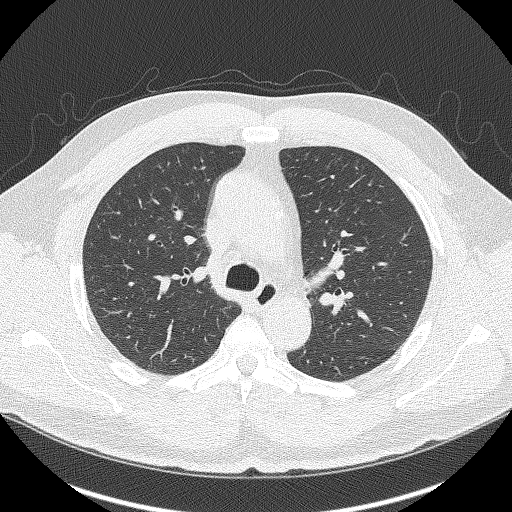
[im 251/325  mediastinal]
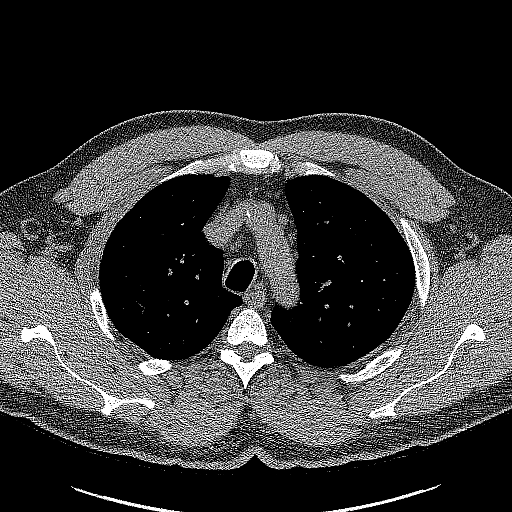
[im 251/325  lung]
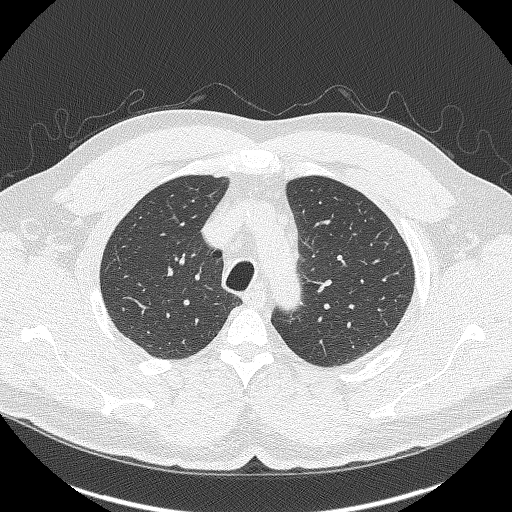
[im 280/325  lung]
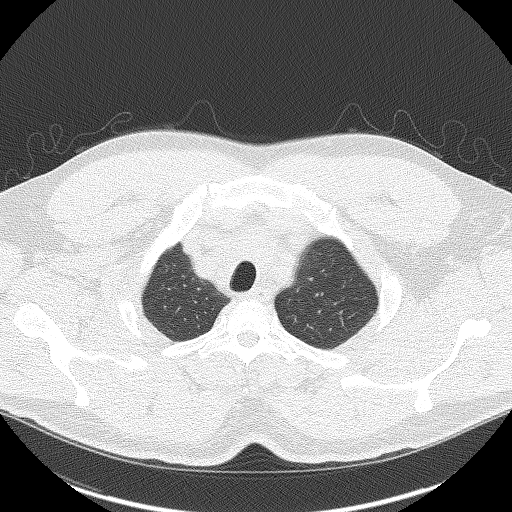
[im 310/325  lung]
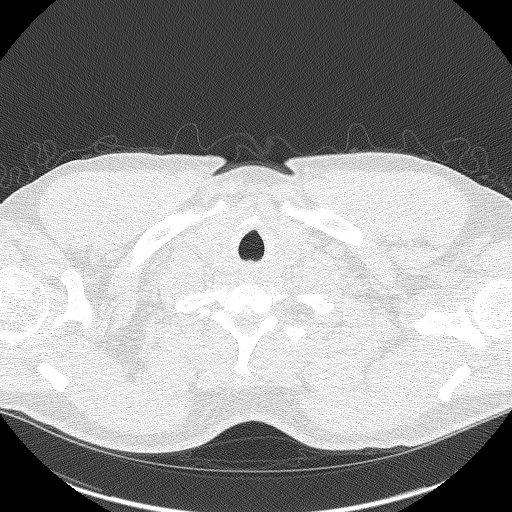

[Series 5: coronals lung 1.00 cor · coronal · 0.64mm/px · 3 of 321 slices shown]
[im 65/321  lung]
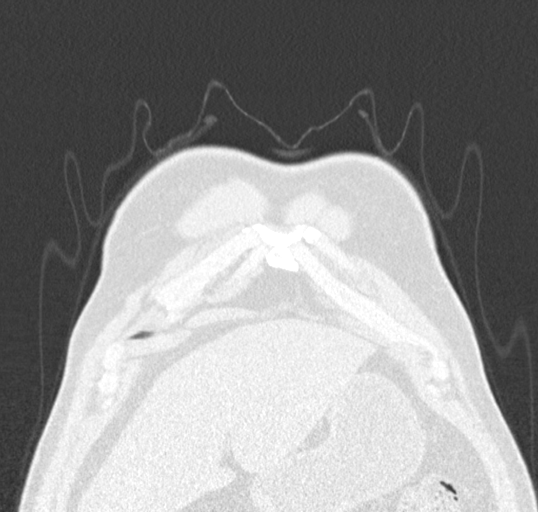
[im 129/321  lung]
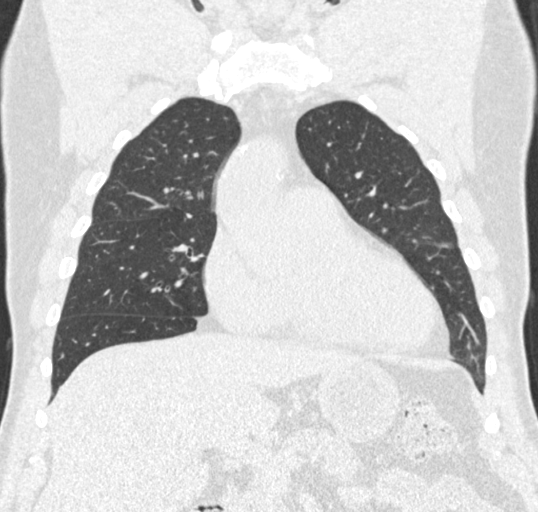
[im 193/321  lung]
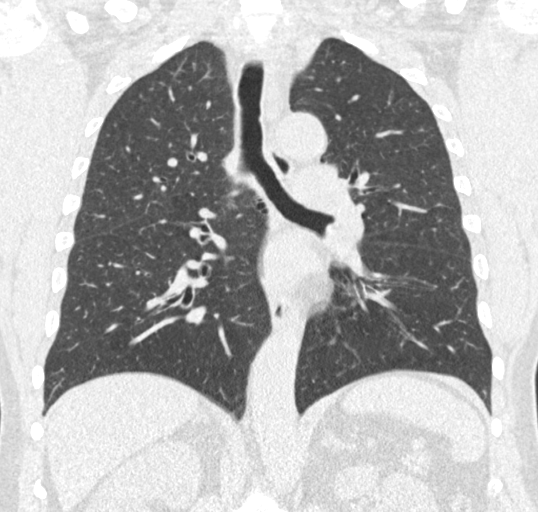

[14 of 40 positions shown; findings below may reference images not displayed]

FINDINGS: Cardiovascular: Heart size is normal. There is no significant
pericardial fluid, thickening or pericardial calcification. There is
aortic atherosclerosis, as well as atherosclerosis of the great
vessels of the mediastinum and the coronary arteries, including
calcified atherosclerotic plaque in the left main, left anterior
descending, left circumflex and right coronary arteries.

Mediastinum/Nodes: No pathologically enlarged mediastinal or hilar
lymph nodes. Please note that accurate exclusion of hilar adenopathy
is limited on noncontrast CT scans. Esophagus is unremarkable in
appearance. No axillary lymphadenopathy.

Lungs/Pleura: Small pulmonary nodules are noted in the lungs,
similar in number and size to the prior study, largest of which is
in the posterior aspect of the left upper lobe abutting the major
fissure (axial image 179 of series 3), with a volume derived mean
diameter of 7.2 mm. No larger more suspicious appearing pulmonary
nodules or masses are noted. No acute consolidative airspace
disease. No pleural effusions. Mild diffuse bronchial wall
thickening with mild centrilobular and paraseptal emphysema.

Upper Abdomen: Aortic atherosclerosis. Incompletely imaged lesion in
the anterior aspect of the interpolar region which appears mildly
complex with heterogeneous internal attenuation, concerning for
potential neoplasm, measuring at least 2.5 x 2.1 cm (axial image 65
of series 2).

Musculoskeletal: There are no aggressive appearing lytic or blastic
lesions noted in the visualized portions of the skeleton.
IMPRESSION: 1. Lung-RADS 2S, benign appearance or behavior. Continue annual
screening with low-dose chest CT without contrast in 12 months.
2. The "S" modifier above refers to potentially clinically
significant non lung cancer related findings. Specifically, there is
an incompletely imaged left renal lesion concerning for potential
renal cell carcinoma. Further evaluation with abdominal MRI with and
without IV gadolinium is strongly recommended in the near future to
provide definitive characterization.
3. Aortic atherosclerosis, in addition to left main and three-vessel
coronary artery disease. Please note that although the presence of
coronary artery calcium documents the presence of coronary artery
disease, the severity of this disease and any potential stenosis
cannot be assessed on this non-gated CT examination. Assessment for
potential risk factor modification, dietary therapy or pharmacologic
therapy may be warranted, if clinically indicated.
4. Mild diffuse bronchial wall thickening with mild centrilobular
and paraseptal emphysema; imaging findings suggestive of underlying
COPD.

These results will be called to the ordering clinician or
representative by the Radiologist Assistant, and communication
documented in the PACS or [REDACTED].

Aortic Atherosclerosis (WYH4Q-0YF.F) and Emphysema ([id number].2).

## 2023-08-25 ENCOUNTER — Emergency Department: Payer: BC Managed Care – PPO

## 2023-08-25 ENCOUNTER — Other Ambulatory Visit: Payer: Self-pay

## 2023-08-25 DIAGNOSIS — M19031 Primary osteoarthritis, right wrist: Secondary | ICD-10-CM | POA: Diagnosis not present

## 2023-08-25 DIAGNOSIS — W208XXA Other cause of strike by thrown, projected or falling object, initial encounter: Secondary | ICD-10-CM | POA: Diagnosis not present

## 2023-08-25 DIAGNOSIS — S60221A Contusion of right hand, initial encounter: Secondary | ICD-10-CM | POA: Insufficient documentation

## 2023-08-25 DIAGNOSIS — M79641 Pain in right hand: Secondary | ICD-10-CM | POA: Diagnosis not present

## 2023-08-25 DIAGNOSIS — M19041 Primary osteoarthritis, right hand: Secondary | ICD-10-CM | POA: Diagnosis not present

## 2023-08-25 DIAGNOSIS — S6991XA Unspecified injury of right wrist, hand and finger(s), initial encounter: Secondary | ICD-10-CM | POA: Diagnosis not present

## 2023-08-25 LAB — COMPREHENSIVE METABOLIC PANEL
ALT: 18 U/L (ref 0–44)
AST: 38 U/L (ref 15–41)
Albumin: 4.2 g/dL (ref 3.5–5.0)
Alkaline Phosphatase: 93 U/L (ref 38–126)
Anion gap: 9 (ref 5–15)
BUN: 13 mg/dL (ref 6–20)
CO2: 22 mmol/L (ref 22–32)
Calcium: 9.6 mg/dL (ref 8.9–10.3)
Chloride: 106 mmol/L (ref 98–111)
Creatinine, Ser: 0.95 mg/dL (ref 0.61–1.24)
GFR, Estimated: >60 mL/min (ref >60)
Glucose, Bld: 102 mg/dL — ABNORMAL HIGH (ref 70–99)
Potassium: 3.8 mmol/L (ref 3.5–5.1)
Sodium: 137 mmol/L (ref 135–145)
Total Bilirubin: 0.8 mg/dL (ref 0.0–1.2)
Total Protein: 7.8 g/dL (ref 6.5–8.1)

## 2023-08-25 LAB — CBC
HCT: 51.5 % (ref 39.0–52.0)
Hemoglobin: 17 g/dL (ref 13.0–17.0)
MCH: 29.7 pg (ref 26.0–34.0)
MCHC: 33 g/dL (ref 30.0–36.0)
MCV: 89.9 fL (ref 80.0–100.0)
Platelets: 325 10*3/uL (ref 150–400)
RBC: 5.73 MIL/uL (ref 4.22–5.81)
RDW: 13.2 % (ref 11.5–15.5)
WBC: 8.6 10*3/uL (ref 4.0–10.5)
nRBC: 0 % (ref 0.0–0.2)

## 2023-08-25 NOTE — ED Triage Notes (Signed)
Pt to ed from home via PO for lump on his neck (right side) that he noticed about an hour ago. Pt unknown how long its been there. Pt has minimal swelling to that area. Pt denies any pain in that area as well.   Pt dropped something heavy on his right hand that he would like looked at as well. Pt has PMS but having some numbness in that hand.  Pt is caox4, in no acute distress and ambulatory in triage.

## 2023-08-26 ENCOUNTER — Emergency Department
Admission: EM | Admit: 2023-08-26 | Discharge: 2023-08-26 | Disposition: A | Discharge status: Home / Self Care | Payer: BC Managed Care – PPO | Attending: Emergency Medicine | Admitting: Emergency Medicine

## 2023-08-26 ENCOUNTER — Other Ambulatory Visit: Payer: Self-pay | Admitting: Family Medicine

## 2023-08-26 DIAGNOSIS — S60221A Contusion of right hand, initial encounter: Secondary | ICD-10-CM

## 2023-08-26 DIAGNOSIS — J438 Other emphysema: Secondary | ICD-10-CM

## 2023-08-26 NOTE — ED Provider Notes (Signed)
[facility] Provider Note    Event Date/Time   First MD Initiated Contact with Patient 08/26/23 [phone]     (approximate)   History   Hand Injury (RIGHT)   HPI Cory Thompson is a 63 y.o. male who presents for evaluation of a contusion to his right hand as well as a small bump on the side of his neck.  He says that he dropped a metal object on his right hand and has some numbness and tingling in the tips of his second, third, and fourth fingers.  He is able to flex and extend his hand.  Swelling earlier but it has mostly resolved and now he just has a lump on the top of the middle of his hand.  At about the same time this happened, he notes he had a bump on the right side of his neck.  He was concerned it might be a blood clot that it traveled from his hand so he thought he should get it checked out.     Physical Exam   Triage Vital Signs: ED Triage Vitals [08/25/23 1948]  Encounter Vitals Group     BP 133/84     Systolic BP Percentile      Diastolic BP Percentile      Pulse Rate 90     Resp 16     Temp 98 F (36.7 C)     Temp Source Oral     SpO2 98 %     Weight 95 kg (209 lb 7 oz)     Height 1.803 m (5\' 11" )     Head Circumference      Peak Flow      Pain Score 6     Pain Loc      Pain Education      Exclude from Growth Chart     Most recent vital signs: Vitals:   08/26/23 0211 08/26/23 0551  BP: 134/81 139/83  Pulse: 78 89  Resp: 16 16  Temp: 98.3 F (36.8 C) 98.6 F (37 C)  SpO2: 93% 94%    General: Awake, no distress.  CV:  Good peripheral perfusion.  Resp:  Normal effort. Speaking easily and comfortably, no accessory muscle usage nor intercostal retractions.   Abd:  No distention.  Other:  The patient has a small hematoma to the dorsal aspect of his right hand in the middle of the middle/third metacarpal.  There is relatively small and not significantly tender to palpation.  He has normal grip strength and flexion extension of all  of his fingers and of his wrist.  He is reporting some subjective numbness in the index, middle and ring fingers, but just very minor.  Additionally he has a small pimple with no surrounding cellulitis or abscess on the right side of his neck in the area of concern.   ED Results / Procedures / Treatments   Labs (all labs ordered are listed, but only abnormal results are displayed) Labs Reviewed  COMPREHENSIVE METABOLIC PANEL - Abnormal; Notable for the following components:      Result Value   Glucose, Bld 102 (*)    All other components within normal limits  CBC     RADIOLOGY I viewed and interpreted the patient's hand x-rays and there is no evidence of fracture or dislocation.   PROCEDURES:  Critical Care performed: No  Procedures    IMPRESSION / MDM / ASSESSMENT AND PLAN / ED COURSE  I reviewed the triage  vital signs and the nursing notes.                              Differential diagnosis includes, but is not limited to, contusion, fracture, dislocation, sprain.  Patient's presentation is most consistent with acute complicated illness / injury requiring diagnostic workup.  Labs/studies ordered: Right hand x-rays, CBC, CMP  Interventions/Medications given:  Medications - No data to display  (Note:  hospital course my include additional interventions and/or labs/studies not listed above.)  Reassuring evaluation, normal x-rays.  The lesion on his neck appears to be a pimple, not an abscess, no cellulitis, not a tumor, no expanding neck hematoma.  I provided reassurance and encouraged outpatient management.  He understands and agrees with the plan.     FINAL CLINICAL IMPRESSION(S) / ED DIAGNOSES   Final diagnoses:  Contusion of dorsum of right hand     Rx / DC Orders   ED Discharge Orders     None        Note:  This document was prepared using Dragon voice recognition software and may include unintentional dictation errors.   Loleta Rose,  MD 08/26/23 [phone]

## 2023-08-26 NOTE — Discharge Instructions (Addendum)
Please read through the included information about management of hand contusion.  Follow-up with your regular doctor as planned.  Of note, the lesion on the side of your neck appears to be a pimple or possibly an ingrown hair.  It should get better with time but you can have your doctor reassess that area on Monday as well.    Return to the emergency department if you develop new or worsening symptoms that concern you.

## 2023-08-28 ENCOUNTER — Ambulatory Visit: Payer: BC Managed Care – PPO | Admitting: Family Medicine

## 2023-09-18 ENCOUNTER — Ambulatory Visit: Payer: BC Managed Care – PPO | Admitting: Family Medicine

## 2023-09-18 ENCOUNTER — Other Ambulatory Visit: Payer: Self-pay

## 2023-09-18 ENCOUNTER — Encounter: Payer: Self-pay | Admitting: Family Medicine

## 2023-09-18 VITALS — BP 118/74 | HR 96 | Ht 71.0 in | Wt 210.0 lb

## 2023-09-18 DIAGNOSIS — E782 Mixed hyperlipidemia: Secondary | ICD-10-CM | POA: Diagnosis not present

## 2023-09-18 DIAGNOSIS — I10 Essential (primary) hypertension: Secondary | ICD-10-CM

## 2023-09-18 DIAGNOSIS — R3914 Feeling of incomplete bladder emptying: Secondary | ICD-10-CM | POA: Diagnosis not present

## 2023-09-18 DIAGNOSIS — N401 Enlarged prostate with lower urinary tract symptoms: Secondary | ICD-10-CM | POA: Diagnosis not present

## 2023-09-18 DIAGNOSIS — F17210 Nicotine dependence, cigarettes, uncomplicated: Secondary | ICD-10-CM

## 2023-09-18 DIAGNOSIS — Z87891 Personal history of nicotine dependence: Secondary | ICD-10-CM

## 2023-09-18 MED ORDER — TAMSULOSIN HCL 0.4 MG PO CAPS
0.4000 mg | ORAL_CAPSULE | Freq: Every day | ORAL | 1 refills | Status: DC
Start: 2023-09-18 — End: 2024-01-22

## 2023-09-18 MED ORDER — AMLODIPINE BESY-BENAZEPRIL HCL 10-40 MG PO CAPS
1.0000 | ORAL_CAPSULE | Freq: Every day | ORAL | 1 refills | Status: DC
Start: 2023-09-18 — End: 2024-01-22

## 2023-09-18 MED ORDER — ATORVASTATIN CALCIUM 40 MG PO TABS
40.0000 mg | ORAL_TABLET | Freq: Every day | ORAL | 1 refills | Status: DC
Start: 2023-09-18 — End: 2024-01-22

## 2023-09-18 NOTE — Patient Instructions (Signed)

## 2023-09-18 NOTE — Progress Notes (Signed)
 Date:  09/18/2023   Name:  Cory Thompson   DOB:  03/02/1961   MRN:  [id number]   Chief Complaint: Hypertension, Hypothyroidism, Diabetes, and Benign Prostatic Hypertrophy  Hypertension This is a chronic problem. The current episode started more than 1 year ago. The problem is controlled. Pertinent negatives include no anxiety, blurred vision, chest pain, headaches, malaise/fatigue, neck pain, orthopnea, palpitations, peripheral edema, PND, shortness of breath or sweats. There are no associated agents to hypertension. Past treatments include calcium channel blockers and ACE inhibitors. The current treatment provides moderate improvement. There are no compliance problems.  There is no history of CAD/MI or CVA. There is no history of chronic renal disease, a hypertension causing med or renovascular disease.  Diabetes He presents for his follow-up diabetic visit. He has type 2 diabetes mellitus. Pertinent negatives for hypoglycemia include no headaches or sweats. Pertinent negatives for diabetes include no blurred vision, no chest pain, no fatigue, no polydipsia and no polyuria. Pertinent negatives for diabetic complications include no CVA. Risk factors for coronary artery disease include hypertension and dyslipidemia. Current diabetic treatments: monjaro. He is compliant with treatment all of the time. He is following a generally healthy diet. Meal planning includes avoidance of concentrated sweets and carbohydrate counting. He participates in exercise intermittently. An ACE inhibitor/angiotensin II receptor blocker is being taken. Eye exam is current.  Benign Prostatic Hypertrophy This is a chronic problem. The current episode started more than 1 year ago. The problem has been gradually improving since onset. Irritative symptoms include nocturia. Irritative symptoms do not include frequency or urgency. Obstructive symptoms do not include dribbling or incomplete emptying. Pertinent negatives include no  dysuria or hematuria. Past treatments include tamsulosin. The treatment provided mild relief.    Lab Results  Component Value Date   NA 137 08/25/2023   K 3.8 08/25/2023   CO2 22 08/25/2023   GLUCOSE 102 (H) 08/25/2023   BUN 13 08/25/2023   CREATININE 0.95 08/25/2023   CALCIUM 9.6 08/25/2023   EGFR 77 01/06/2023   GFRNONAA >60 08/25/2023   Lab Results  Component Value Date   CHOL 169 01/06/2023   HDL 34 (L) 01/06/2023   LDLCALC 113 (H) 01/06/2023   TRIG 121 01/06/2023   No results found for: "TSH" Lab Results  Component Value Date   HGBA1C 7.1 (H) 01/06/2023   Lab Results  Component Value Date   WBC 8.6 08/25/2023   HGB 17.0 08/25/2023   HCT 51.5 08/25/2023   MCV 89.9 08/25/2023   PLT 325 08/25/2023   Lab Results  Component Value Date   ALT 18 08/25/2023   AST 38 08/25/2023   ALKPHOS 93 08/25/2023   BILITOT 0.8 08/25/2023   No results found for: "25OHVITD2", "25OHVITD3", "VD25OH"   Review of Systems  Constitutional:  Negative for fatigue and malaise/fatigue.  HENT:  Negative for congestion.   Eyes:  Negative for blurred vision.  Respiratory:  Negative for apnea, cough, chest tightness, shortness of breath, wheezing and stridor.   Cardiovascular:  Negative for chest pain, palpitations, orthopnea, leg swelling and PND.  Gastrointestinal:  Negative for blood in stool.  Endocrine: Negative for polydipsia and polyuria.  Genitourinary:  Positive for nocturia. Negative for difficulty urinating, dysuria, frequency, hematuria, incomplete emptying, scrotal swelling and urgency.  Musculoskeletal:  Negative for arthralgias and neck pain.  Neurological:  Negative for headaches.    Patient Active Problem List   Diagnosis Date Noted   Encounter for screening colonoscopy 03/06/2023   Polyp of  transverse colon 03/06/2023   Paraseptal emphysema (HCC) 10/17/2022   Type 2 diabetes mellitus with hyperglycemia, without long-term current use of insulin (HCC) 12/07/2020    Essential hypertension 05/14/2018   Cigarette nicotine dependence, uncomplicated 05/14/2018   History of stomach ulcers 05/14/2018   Benign prostatic hyperplasia with urinary obstruction 11/29/2017   Sleep apnea 07/25/2017    No Known Allergies  Past Surgical History:  Procedure Laterality Date   COLONOSCOPY WITH PROPOFOL N/A 03/06/2023   Procedure: COLONOSCOPY WITH BIOPSY;  Surgeon: Midge Minium, MD;  Location: [facility];  Service: Endoscopy;  Laterality: N/A;   POLYPECTOMY N/A 03/06/2023   Procedure: POLYPECTOMY;  Surgeon: Midge Minium, MD;  Location: [facility];  Service: Endoscopy;  Laterality: N/A;   TOOTH EXTRACTION      Social History   Tobacco Use   Smoking status: Every Day    Current packs/day: 0.50    Average packs/day: 0.6 packs/day for 61.1 years (38.3 ttl pk-yrs)    Types: Cigarettes    Start date: 1995   Smokeless tobacco: Never  Vaping Use   Vaping status: Never Used  Substance Use Topics   Alcohol use: Yes    Comment: socially, every couple of weeks   Drug use: Never     Medication list has been reviewed and updated.  Current Meds  Medication Sig   albuterol (VENTOLIN HFA) 108 (90 Base) MCG/ACT inhaler TAKE 2 PUFFS BY MOUTH EVERY 4 HOURS AS NEEDED   amLODipine-benazepril (LOTREL) 10-40 MG capsule TAKE 1 CAPSULE BY MOUTH EVERY DAY   atorvastatin (LIPITOR) 40 MG tablet Take 1 tablet (40 mg total) by mouth daily.   glucose blood test strip Check your sugar in the morning before you eat breakfast, and one hour after a meal.   glucose monitoring kit (FREESTYLE) monitoring kit 1 each by Does not apply route daily. Check glucose once in the morning before breakfast and 1 hour after a meal   ipratropium (ATROVENT) 0.06 % nasal spray Place 2 sprays into both nostrils 4 (four) times daily. (Patient taking differently: Place 2 sprays into both nostrils as needed.)   Spacer/Aero-Holding Chambers (AEROCHAMBER MV) inhaler Use as instructed    tamsulosin (FLOMAX) 0.4 MG CAPS capsule TAKE 1 CAPSULE BY MOUTH EVERY DAY   [DISCONTINUED] amoxicillin-clavulanate (AUGMENTIN) 875-125 MG tablet Take 1 tablet by mouth every 12 (twelve) hours.       09/18/2023    8:25 AM 10/17/2022    9:52 AM 07/11/2022   10:17 AM 04/04/2022   11:41 AM  GAD 7 : Generalized Anxiety Score  Nervous, Anxious, on Edge 0 0 0 0  Control/stop worrying 0 0 0 0  Worry too much - different things 0 0 0 0  Trouble relaxing 1 0 0 0  Restless 1 0 0 0  Easily annoyed or irritable 0 0 0 0  Afraid - awful might happen 0 0 0 0  Total GAD 7 Score 2 0 0 0  Anxiety Difficulty Not difficult at all Not difficult at all Not difficult at all Not difficult at all       09/18/2023    8:25 AM 10/17/2022    9:52 AM 07/11/2022   10:16 AM  Depression screen PHQ 2/9  Decreased Interest 0 0 0  Down, Depressed, Hopeless 0 0 0  PHQ - 2 Score 0 0 0  Altered sleeping 1 0 0  Tired, decreased energy 1 0 0  Change in appetite 0 0 0  Feeling bad  or failure about yourself  0 0 0  Trouble concentrating 0 0 0  Moving slowly or fidgety/restless 0 0 0  Suicidal thoughts 0 0 0  PHQ-9 Score 2 0 0  Difficult doing work/chores Not difficult at all  Not difficult at all    BP Readings from Last 3 Encounters:  09/18/23 118/74  08/26/23 139/83  07/21/23 125/70    Physical Exam Vitals and nursing note reviewed.  Constitutional:      Appearance: He is well-developed.  HENT:     Head: Normocephalic and atraumatic.     Right Ear: Tympanic membrane, ear canal and external ear normal.     Left Ear: Tympanic membrane, ear canal and external ear normal.     Nose: Nose normal.     Mouth/Throat:     Dentition: Normal dentition.  Eyes:     General: Lids are normal. No scleral icterus.    Conjunctiva/sclera: Conjunctivae normal.     Pupils: Pupils are equal, round, and reactive to light.  Neck:     Thyroid: No thyromegaly.     Vascular: No carotid bruit, hepatojugular reflux or JVD.      Trachea: No tracheal deviation.  Cardiovascular:     Rate and Rhythm: Normal rate and regular rhythm.     Heart sounds: Normal heart sounds.  Pulmonary:     Effort: Pulmonary effort is normal.     Breath sounds: Normal breath sounds.  Abdominal:     General: Bowel sounds are normal.     Palpations: Abdomen is soft. There is no hepatomegaly, splenomegaly or mass.     Tenderness: There is no abdominal tenderness.     Hernia: There is no hernia in the left inguinal area.  Musculoskeletal:        General: Normal range of motion.     Cervical back: Normal range of motion and neck supple.  Lymphadenopathy:     Cervical: No cervical adenopathy.  Skin:    General: Skin is warm and dry.     Findings: No rash.  Neurological:     Mental Status: He is alert and oriented to person, place, and time.     Sensory: No sensory deficit.     Deep Tendon Reflexes: Reflexes are normal and symmetric.  Psychiatric:        Mood and Affect: Mood is not anxious or depressed.     Wt Readings from Last 3 Encounters:  09/18/23 210 lb (95.3 kg)  08/25/23 209 lb 7 oz (95 kg)  07/21/23 209 lb 3.5 oz (94.9 kg)    BP 118/74   Pulse 96   Ht 5\' 11"  (1.803 m)   Wt 210 lb (95.3 kg)   SpO2 97%   BMI 29.29 kg/m   Assessment and Plan:  1. Primary hypertension (Primary) Chronic.  Controlled.  Stable.  Blood pressure today is 118/74.  Asymptomatic.  Tolerating medication well.  Continue amlodipine benazepril 10-40 mg once a day.  Will recheck patient in 6 months.  Review of CMP done at hospital in January is acceptable for electrolytes and GFR. - amLODipine-benazepril (LOTREL) 10-40 MG capsule; Take 1 capsule by mouth daily.  Dispense: 90 capsule; Refill: 1  2. Moderate mixed hyperlipidemia not requiring statin therapy Chronic.  Controlled.  Stable.  Asymptomatic.  Reemphasized low-cholesterol low dietary guidelines.  Continue atorvastatin 40 mg once a day.  Review of lipid panel forthcoming as well as review  of CMP notes no hepatic concerns at this time. -  atorvastatin (LIPITOR) 40 MG tablet; Take 1 tablet (40 mg total) by mouth daily.  Dispense: 90 tablet; Refill: 1 - Lipid Panel With LDL/HDL Ratio  3. Benign prostatic hyperplasia with incomplete bladder emptying Chronic controlled.  Asymptomatic.  Occasional nocturia he for the most part is controlled with medication.  On last examination the prostate was somewhat asymmetrical with the right being little bit larger than the left we will refer to urology for follow-up in the meantime we will check a PSA for current status of prostate maintenance.  Will recheck patient in 6 months. - tamsulosin (FLOMAX) 0.4 MG CAPS capsule; Take 1 capsule (0.4 mg total) by mouth daily.  Dispense: 60 capsule; Refill: 1 - Ambulatory referral to Urology - PSA    Elizabeth Sauer, MD

## 2023-10-05 ENCOUNTER — Ambulatory Visit: Admission: RE | Admit: 2023-10-05 | Payer: BC Managed Care – PPO | Source: Ambulatory Visit

## 2023-10-06 ENCOUNTER — Ambulatory Visit
Admission: RE | Admit: 2023-10-06 | Discharge: 2023-10-06 | Disposition: A | Discharge status: Home / Self Care | Source: Ambulatory Visit | Attending: Acute Care | Admitting: Acute Care

## 2023-10-06 DIAGNOSIS — N401 Enlarged prostate with lower urinary tract symptoms: Secondary | ICD-10-CM | POA: Diagnosis not present

## 2023-10-06 DIAGNOSIS — E782 Mixed hyperlipidemia: Secondary | ICD-10-CM | POA: Diagnosis not present

## 2023-10-06 DIAGNOSIS — Z87891 Personal history of nicotine dependence: Secondary | ICD-10-CM | POA: Insufficient documentation

## 2023-10-06 DIAGNOSIS — F17210 Nicotine dependence, cigarettes, uncomplicated: Secondary | ICD-10-CM | POA: Insufficient documentation

## 2023-10-06 DIAGNOSIS — Z122 Encounter for screening for malignant neoplasm of respiratory organs: Secondary | ICD-10-CM | POA: Diagnosis not present

## 2023-10-06 DIAGNOSIS — R3914 Feeling of incomplete bladder emptying: Secondary | ICD-10-CM | POA: Diagnosis not present

## 2023-10-07 ENCOUNTER — Encounter: Payer: Self-pay | Admitting: Family Medicine

## 2023-10-07 LAB — LIPID PANEL WITH LDL/HDL RATIO
Cholesterol, Total: 171 mg/dL (ref 100–199)
HDL: 32 mg/dL — ABNORMAL LOW (ref >39)
LDL Chol Calc (NIH): 115 mg/dL — ABNORMAL HIGH (ref 0–99)
LDL/HDL Ratio: 3.6 ratio (ref 0.0–3.6)
Triglycerides: 135 mg/dL (ref 0–149)
VLDL Cholesterol Cal: 24 mg/dL (ref 5–40)

## 2023-10-07 LAB — PSA: Prostate Specific Ag, Serum: 2.8 ng/mL (ref 0.0–4.0)

## 2023-10-09 ENCOUNTER — Ambulatory Visit: Payer: BC Managed Care – PPO

## 2023-10-23 ENCOUNTER — Ambulatory Visit: Payer: BC Managed Care – PPO | Admitting: Urology

## 2023-10-30 DIAGNOSIS — E785 Hyperlipidemia, unspecified: Secondary | ICD-10-CM | POA: Diagnosis not present

## 2023-10-30 DIAGNOSIS — E1169 Type 2 diabetes mellitus with other specified complication: Secondary | ICD-10-CM | POA: Diagnosis not present

## 2023-10-30 DIAGNOSIS — E119 Type 2 diabetes mellitus without complications: Secondary | ICD-10-CM | POA: Diagnosis not present

## 2023-10-30 DIAGNOSIS — E1159 Type 2 diabetes mellitus with other circulatory complications: Secondary | ICD-10-CM | POA: Diagnosis not present

## 2023-10-30 DIAGNOSIS — H18451 Nodular corneal degeneration, right eye: Secondary | ICD-10-CM | POA: Diagnosis not present

## 2023-10-30 DIAGNOSIS — H2513 Age-related nuclear cataract, bilateral: Secondary | ICD-10-CM | POA: Diagnosis not present

## 2023-10-30 LAB — HM DIABETES EYE EXAM

## 2023-10-30 LAB — HEMOGLOBIN A1C: Hemoglobin A1C: 6.4

## 2023-11-13 ENCOUNTER — Telehealth: Payer: Self-pay | Admitting: Acute Care

## 2023-11-13 DIAGNOSIS — Z122 Encounter for screening for malignant neoplasm of respiratory organs: Secondary | ICD-10-CM

## 2023-11-13 DIAGNOSIS — Z87891 Personal history of nicotine dependence: Secondary | ICD-10-CM

## 2023-11-13 NOTE — Telephone Encounter (Signed)
 Called and spoke to patient regarding his results. Patient aware his lung nodules are stable and we will repeat scan in 12 months for annual screening, order placed. We discussed the other noted findings in detail - CAD, aortic atherosclerosis, TAA, and PAH. Patient states he just started on a statin, per pt's chart its Lipitor. Per pt's chart he does not have a cardiologist. Patient is aware we will notify pt's PCP of the cardiac findings and will decide if a cardiology referral is clinical indicated. Will send message with results to pt's PCP, Dr. Alayne Allis, to make aware. Patient has an appt with primary care office in [place] with Laroy Plunk on 01/22/24.       IMPRESSION: 1. Lung-RADS 2, benign appearance or behavior. Continue annual screening with low-dose chest CT without contrast in 12 months. 2. Age advanced three-vessel coronary artery calcification. 3.  Aortic atherosclerosis (ICD10-I70.0). 4. 4.1 cm ascending aortic aneurysm, stable. Recommend annual imaging followup by CTA or MRA. This recommendation follows 2010 ACCF/AHA/AATS/ACR/ASA/SCA/SCAI/SIR/STS/SVM Guidelines for the Diagnosis and Management of Patients with Thoracic Aortic Disease. Circulation. 2010; 121: P295-J884. Aortic aneurysm NOS (ICD10-I71.9). 5. Enlarged pulmonic trunk, indicative of pulmonary arterial hypertension. 6.  Emphysema (ICD10-J43.9).     Electronically Signed   By: Shearon Denis M.D.   On: 11/13/2023 10:18

## 2023-12-04 ENCOUNTER — Encounter: Payer: Self-pay | Admitting: Urology

## 2023-12-04 ENCOUNTER — Ambulatory Visit: Admitting: Urology

## 2023-12-04 VITALS — BP 125/78 | HR 90 | Ht 71.0 in | Wt 209.0 lb

## 2023-12-04 DIAGNOSIS — N400 Enlarged prostate without lower urinary tract symptoms: Secondary | ICD-10-CM

## 2023-12-04 DIAGNOSIS — N401 Enlarged prostate with lower urinary tract symptoms: Secondary | ICD-10-CM

## 2023-12-04 LAB — URINALYSIS, COMPLETE
Bilirubin, UA: NEGATIVE
Glucose, UA: NEGATIVE
Ketones, UA: NEGATIVE
Leukocytes,UA: NEGATIVE
Nitrite, UA: NEGATIVE
Protein,UA: NEGATIVE
RBC, UA: NEGATIVE
Specific Gravity, UA: 1.02 (ref 1.005–1.030)
Urobilinogen, Ur: 2 mg/dL — ABNORMAL HIGH (ref 0.2–1.0)
pH, UA: 6 (ref 5.0–7.5)

## 2023-12-04 LAB — MICROSCOPIC EXAMINATION

## 2023-12-04 NOTE — Progress Notes (Signed)
 I, Maysun Jamey Mccallum, acting as a Neurosurgeon for Cory Knapp, MD., have documented all relevant documentation on the behalf of Cory Knapp, MD, as directed by Cory Knapp, MD while in the presence of Cory Knapp, MD.  Discussed the use of AI scribe software for clinical note transcription with the patient, who gave verbal consent to proceed.   12/04/2023 4:33 PM   Freeman Jersey 01/02/1961 [id number]  Referring provider: Clarise Crooks, MD [address] [place],  [place] [address]  Chief Complaint  Patient presents with   New Patient (Initial Visit)    HPI: Cory Thompson is a 63 y.o. male referred for evaluation of BPH.  Saw Dr Rochelle Chu February 2025 complaining of bothersome lower urinary tract symptoms and was started on tamsulosin  with improvement in his voiding pattern His most bothersome symptoms have been urinary hesitancy, intermittent urinary stream. His symptoms are worse if he holds his urine.  Denies dysuria, gross hematuria Denies flank, abdominal, or pelvic pain.  PSA March 2025 was stable at 2.8  PSA trend   Prostate Specific Ag, Serum  Latest Ref Rng 0.0 - 4.0 ng/mL  10/01/2021 2.7   01/06/2023 2.6   10/06/2023 2.8      PMH: Past Medical History:  Diagnosis Date   COPD (chronic obstructive pulmonary disease) (HCC)    Diabetes mellitus without complication (HCC)    Full dentures    Top and Bottom   GERD (gastroesophageal reflux disease)    Hypertension    Sleep apnea    no CPAP   TMJ (dislocation of temporomandibular joint)     Surgical History: Past Surgical History:  Procedure Laterality Date   COLONOSCOPY WITH PROPOFOL  N/A 03/06/2023   Procedure: COLONOSCOPY WITH BIOPSY;  Surgeon: Marnee Sink, MD;  Location: [facility];  Service: Endoscopy;  Laterality: N/A;   POLYPECTOMY N/A 03/06/2023   Procedure: POLYPECTOMY;  Surgeon: Marnee Sink, MD;  Location: [facility];  Service: Endoscopy;  Laterality: N/A;   TOOTH  EXTRACTION      Home Medications:  Allergies as of 12/04/2023   No Known Allergies      Medication List        Accurate as of Dec 04, 2023  4:33 PM. If you have any questions, ask your nurse or doctor.          AeroChamber MV inhaler Use as instructed   albuterol  108 (90 Base) MCG/ACT inhaler Commonly known as: VENTOLIN  HFA TAKE 2 PUFFS BY MOUTH EVERY 4 HOURS AS NEEDED   amLODipine -benazepril  10-40 MG capsule Commonly known as: LOTREL Take 1 capsule by mouth daily.   atorvastatin  40 MG tablet Commonly known as: LIPITOR Take 1 tablet (40 mg total) by mouth daily.   glucose blood test strip Check your sugar in the morning before you eat breakfast, and one hour after a meal.   glucose monitoring kit monitoring kit 1 each by Does not apply route daily. Check glucose once in the morning before breakfast and 1 hour after a meal   ipratropium 0.06 % nasal spray Commonly known as: ATROVENT  Place 2 sprays into both nostrils 4 (four) times daily. What changed:  when to take this reasons to take this   Mounjaro 2.5 MG/0.5ML Pen Generic drug: tirzepatide Inject 2.5 mg into the skin once a week.   tamsulosin  0.4 MG Caps capsule Commonly known as: FLOMAX  Take 1 capsule (0.4 mg total) by mouth daily.  Allergies: No Known Allergies  Family History: Family History  Problem Relation Age of Onset   Diabetes Mother    Hypertension Mother    Hypertension Father     Social History:  reports that he has been smoking cigarettes. He started smoking about 30 years ago. He has a 38.4 pack-year smoking history. He has never used smokeless tobacco. He reports current alcohol use. He reports that he does not use drugs.   Physical Exam: BP 125/78   Pulse 90   Ht 5\' 11"  (1.803 m)   Wt 209 lb (94.8 kg)   BMI 29.15 kg/m   Constitutional:  Alert and oriented, No acute distress. HEENT: [place] AT Respiratory: Normal respiratory effort, no increased work of breathing. GU:  Prostate 40 grams, smooth without nodules.  Psychiatric: Normal mood and affect.   Urinalysis Dipstick/microscopy negative.    Assessment & Plan:    1. BPH with LUTS Moderate lower urinary tract symptoms. Management options were reviewed, including continuing Tamsulosin , titrating Tamsulosin  to 0.8 mg, adding a 5-alpha-reductase inhibitor, and surgical options. The patient is currently satisfied with his voiding patterns and prefers to continue with the current medication regimen. Advised to continue Tamsulosin  as prescribed. Discussed the possibility of increasing Tamsulosin  dosage if symptoms worsen. Surgical options were discussed but are not preferred by the patient at this time.  I have reviewed the above documentation for accuracy and completeness, and I agree with the above.   Cory Knapp, MD  [facility] [address] [place], [place] [address] [phone]

## 2023-12-18 ENCOUNTER — Other Ambulatory Visit: Payer: Self-pay | Admitting: Family Medicine

## 2023-12-18 DIAGNOSIS — J438 Other emphysema: Secondary | ICD-10-CM

## 2024-01-16 ENCOUNTER — Other Ambulatory Visit: Payer: Self-pay

## 2024-01-16 DIAGNOSIS — J438 Other emphysema: Secondary | ICD-10-CM

## 2024-01-16 MED ORDER — ALBUTEROL SULFATE HFA 108 (90 BASE) MCG/ACT IN AERS
2.0000 | INHALATION_SPRAY | Freq: Four times a day (QID) | RESPIRATORY_TRACT | 1 refills | Status: AC | PRN
Start: 2024-01-16 — End: ?

## 2024-01-22 ENCOUNTER — Ambulatory Visit: Admitting: Physician Assistant

## 2024-01-22 ENCOUNTER — Encounter: Payer: Self-pay | Admitting: Physician Assistant

## 2024-01-22 VITALS — BP 118/74 | HR 78 | Temp 97.8°F | Ht 71.0 in | Wt 213.0 lb

## 2024-01-22 DIAGNOSIS — F17200 Nicotine dependence, unspecified, uncomplicated: Secondary | ICD-10-CM | POA: Insufficient documentation

## 2024-01-22 DIAGNOSIS — Z7985 Long-term (current) use of injectable non-insulin antidiabetic drugs: Secondary | ICD-10-CM | POA: Diagnosis not present

## 2024-01-22 DIAGNOSIS — E785 Hyperlipidemia, unspecified: Secondary | ICD-10-CM

## 2024-01-22 DIAGNOSIS — R3914 Feeling of incomplete bladder emptying: Secondary | ICD-10-CM

## 2024-01-22 DIAGNOSIS — E1165 Type 2 diabetes mellitus with hyperglycemia: Secondary | ICD-10-CM | POA: Diagnosis not present

## 2024-01-22 DIAGNOSIS — E1169 Type 2 diabetes mellitus with other specified complication: Secondary | ICD-10-CM | POA: Diagnosis not present

## 2024-01-22 DIAGNOSIS — I10 Essential (primary) hypertension: Secondary | ICD-10-CM | POA: Diagnosis not present

## 2024-01-22 DIAGNOSIS — N401 Enlarged prostate with lower urinary tract symptoms: Secondary | ICD-10-CM

## 2024-01-22 DIAGNOSIS — I861 Scrotal varices: Secondary | ICD-10-CM

## 2024-01-22 DIAGNOSIS — J438 Other emphysema: Secondary | ICD-10-CM

## 2024-01-22 LAB — POCT GLYCOSYLATED HEMOGLOBIN (HGB A1C): Hemoglobin A1C: 5.9 % — AB (ref 4.0–5.6)

## 2024-01-22 MED ORDER — ATORVASTATIN CALCIUM 40 MG PO TABS: 40.0000 mg | ORAL_TABLET | Freq: Every day | ORAL | 2 refills | Status: AC

## 2024-01-22 MED ORDER — AMLODIPINE BESY-BENAZEPRIL HCL 10-40 MG PO CAPS: 1.0000 | ORAL_CAPSULE | Freq: Every day | ORAL | 2 refills | Status: AC

## 2024-01-22 MED ORDER — TAMSULOSIN HCL 0.4 MG PO CAPS
0.4000 mg | ORAL_CAPSULE | Freq: Every day | ORAL | 2 refills | Status: AC
Start: 2024-01-22 — End: ?

## 2024-01-22 NOTE — Assessment & Plan Note (Signed)
 Discussed natural progression of this condition, complicated by ongoing smoking.  He is ready to quit.

## 2024-01-22 NOTE — Assessment & Plan Note (Signed)
 Continue atorvastatin , will check fasting lipids next time and might consider dose adjustment pending those labs.

## 2024-01-22 NOTE — Assessment & Plan Note (Signed)
 Well-controlled with current medications, refilling today

## 2024-01-22 NOTE — Assessment & Plan Note (Signed)
 A1c 5.9% today reflecting continued improvement with Mounjaro, continue this therapy.  Encouraged to make follow-up with endocrinology.

## 2024-01-22 NOTE — Assessment & Plan Note (Signed)
Continue Mounjaro.

## 2024-01-22 NOTE — Assessment & Plan Note (Signed)
 Patient seems ready.  Referring to smoking cessation program.

## 2024-01-22 NOTE — Assessment & Plan Note (Signed)
 Stable, refill tamsulosin

## 2024-01-22 NOTE — Progress Notes (Addendum)
 Date:  01/22/2024   Name:  Cory Thompson   DOB:  1961-03-09   MRN:  [id number]   Chief Complaint: Medical Management of Chronic Issues (HTN ,DM, HLD)  HPI Cory Thompson is a very pleasant 63 y.o. male with HTN, HLD, DM2, BPH, and tobacco use disorder who presents today for his annual physical exam, brings a form for me to complete.  Completed visit with urology 12/04/2023 who he sees for BPH, stable on tamsulosin  0.4 mg.  Last PSA 2.8 on 10/06/2023, stable.   Sees endocrinology for management of his DM2, last visit with them 10/30/23 when he was titrated up on Mounjaro, currently at the 5 mg/week dose and tolerating well.  Last A1c was 6.4% about 3 months ago, due for follow-up with them soon but nothing is scheduled.  Patient acknowledges the need for smoking cessation.  Says he quit for about a year roughly 10 years ago, and is confident he can do it again.  Would be interested in smoking cessation. Has emphysema.    Medication list has been reviewed and updated.  Current Meds  Medication Sig   albuterol  (VENTOLIN  HFA) 108 (90 Base) MCG/ACT inhaler Inhale 2 puffs into the lungs every 6 (six) hours as needed for wheezing or shortness of breath.   glucose blood test strip Check your sugar in the morning before you eat breakfast, and one hour after a meal.   glucose monitoring kit (FREESTYLE) monitoring kit 1 each by Does not apply route daily. Check glucose once in the morning before breakfast and 1 hour after a meal   ipratropium (ATROVENT ) 0.06 % nasal spray Place 2 sprays into both nostrils 4 (four) times daily.   MOUNJARO 5 MG/0.5ML Pen    Spacer/Aero-Holding Chambers (AEROCHAMBER MV) inhaler Use as instructed   [DISCONTINUED] amLODipine -benazepril  (LOTREL) 10-40 MG capsule Take 1 capsule by mouth daily.   [DISCONTINUED] atorvastatin  (LIPITOR) 40 MG tablet Take 1 tablet (40 mg total) by mouth daily.   [DISCONTINUED] MOUNJARO 2.5 MG/0.5ML Pen Inject 2.5 mg into the skin once a week.    [DISCONTINUED] tamsulosin  (FLOMAX ) 0.4 MG CAPS capsule Take 1 capsule (0.4 mg total) by mouth daily.     Review of Systems  Patient Active Problem List   Diagnosis Date Noted   Tobacco use disorder 01/22/2024   Hyperlipidemia associated with type 2 diabetes mellitus (HCC) 01/22/2024   Long-term current use of injectable noninsulin antidiabetic medication 01/22/2024   Encounter for screening colonoscopy 03/06/2023   Polyp of transverse colon 03/06/2023   Paraseptal emphysema (HCC) 10/17/2022   Type 2 diabetes mellitus with hyperglycemia, without long-term current use of insulin (HCC) 12/07/2020   Primary hypertension 05/14/2018   History of stomach ulcers 05/14/2018   Benign prostatic hyperplasia with incomplete bladder emptying 11/29/2017   Sleep apnea 07/25/2017    No Known Allergies  Immunization History  Administered Date(s) Administered   Influenza,inj,Quad PF,6+ Mos 05/14/2018, 05/14/2018, 04/22/2019, 04/04/2022   Influenza-Unspecified 05/14/2018, 04/22/2019   PFIZER(Purple Top)SARS-COV-2 Vaccination 11/16/2019, 12/07/2019, 08/02/2020   Pneumococcal Polysaccharide-23 05/25/2018   Tdap 05/25/2018    Past Surgical History:  Procedure Laterality Date   COLONOSCOPY WITH PROPOFOL  N/A 03/06/2023   Procedure: COLONOSCOPY WITH BIOPSY;  Surgeon: Jinny Carmine, MD;  Location: [facility];  Service: Endoscopy;  Laterality: N/A;   POLYPECTOMY N/A 03/06/2023   Procedure: POLYPECTOMY;  Surgeon: Jinny Carmine, MD;  Location: [facility];  Service: Endoscopy;  Laterality: N/A;   TOOTH EXTRACTION      Social History  Tobacco Use   Smoking status: Every Day    Current packs/day: 0.50    Average packs/day: 0.6 packs/day for 61.5 years (38.5 ttl pk-yrs)    Types: Cigarettes    Start date: 1995   Smokeless tobacco: Never  Vaping Use   Vaping status: Never Used  Substance Use Topics   Alcohol use: Yes    Comment: socially, every couple of weeks   Drug use: Never     Family History  Problem Relation Age of Onset   Diabetes Mother    Hypertension Mother    Hypertension Father         01/22/2024    8:12 AM 09/18/2023    8:25 AM 10/17/2022    9:52 AM 07/11/2022   10:17 AM  GAD 7 : Generalized Anxiety Score  Nervous, Anxious, on Edge 0 0 0 0  Control/stop worrying 1 0 0 0  Worry too much - different things 1 0 0 0  Trouble relaxing 1 1 0 0  Restless 1 1 0 0  Easily annoyed or irritable 0 0 0 0  Afraid - awful might happen 0 0 0 0  Total GAD 7 Score 4 2 0 0  Anxiety Difficulty Not difficult at all Not difficult at all Not difficult at all Not difficult at all       01/22/2024    8:12 AM 09/18/2023    8:25 AM 10/17/2022    9:52 AM  Depression screen PHQ 2/9  Decreased Interest 1 0 0  Down, Depressed, Hopeless 0 0 0  PHQ - 2 Score 1 0 0  Altered sleeping  1 0  Tired, decreased energy  1 0  Change in appetite  0 0  Feeling bad or failure about yourself   0 0  Trouble concentrating  0 0  Moving slowly or fidgety/restless  0 0  Suicidal thoughts  0 0  PHQ-9 Score  2 0  Difficult doing work/chores  Not difficult at all     BP Readings from Last 3 Encounters:  01/22/24 118/74  12/04/23 125/78  09/18/23 118/74    Wt Readings from Last 3 Encounters:  01/22/24 213 lb (96.6 kg)  12/04/23 209 lb (94.8 kg)  09/18/23 210 lb (95.3 kg)    BP 118/74   Pulse 78   Temp 97.8 F (36.6 C)   Ht 5' 11 (1.803 m)   Wt 213 lb (96.6 kg)   SpO2 94%   BMI 29.71 kg/m   Physical Exam Vitals and nursing note reviewed.  Constitutional:      Appearance: Normal appearance.  HENT:     Ears:     Comments: EAC clear bilaterally with good view of TM which is without effusion or erythema.     Nose: Nose normal.     Mouth/Throat:     Mouth: Mucous membranes are moist. No oral lesions.     Dentition: Normal dentition.     Pharynx: No posterior oropharyngeal erythema.  Eyes:     Extraocular Movements: Extraocular movements intact.      Conjunctiva/sclera: Conjunctivae normal.     Pupils: Pupils are equal, round, and reactive to light.  Neck:     Thyroid: No thyromegaly.  Cardiovascular:     Rate and Rhythm: Normal rate and regular rhythm.     Heart sounds: No murmur heard.    No friction rub. No gallop.     Comments: Pulses 2+ at radial, PT, DP bilaterally. No carotid bruit. No  peripheral edema Pulmonary:     Effort: Pulmonary effort is normal.     Breath sounds: Decreased breath sounds present.  Abdominal:     General: Bowel sounds are normal.     Palpations: Abdomen is soft. There is no mass.     Tenderness: There is no abdominal tenderness.  Genitourinary:    Penis: Normal.      Testes:        Right: Varicocele present. Mass not present.        Left: Varicocele present. Mass not present.     Prostate: Enlarged. Not tender and no nodules present.     Rectum: Normal. Guaiac result negative. No mass.  Musculoskeletal:     Comments: Full ROM with strength 5/5 bilateral upper and lower extremities  Lymphadenopathy:     Cervical: No cervical adenopathy.  Skin:    General: Skin is warm.     Capillary Refill: Capillary refill takes less than 2 seconds.     Findings: No lesion or rash.  Neurological:     Mental Status: He is alert and oriented to person, place, and time.     Gait: Gait is intact.  Psychiatric:        Mood and Affect: Mood normal.        Behavior: Behavior normal.     Recent Labs     Component Value Date/Time   NA 137 08/25/2023 2247   NA 138 01/06/2023 1402   K 3.8 08/25/2023 2247   CL 106 08/25/2023 2247   CO2 22 08/25/2023 2247   GLUCOSE 102 (H) 08/25/2023 2247   BUN 13 08/25/2023 2247   BUN 12 01/06/2023 1402   CREATININE 0.95 08/25/2023 2247   CALCIUM  9.6 08/25/2023 2247   PROT 7.8 08/25/2023 2247   PROT 7.3 01/06/2023 1402   ALBUMIN 4.2 08/25/2023 2247   ALBUMIN 4.5 01/06/2023 1402   AST 38 08/25/2023 2247   ALT 18 08/25/2023 2247   ALKPHOS 93 08/25/2023 2247   BILITOT 0.8  08/25/2023 2247   BILITOT 0.4 01/06/2023 1402   GFRNONAA >60 08/25/2023 2247    Lab Results  Component Value Date   WBC 8.6 08/25/2023   HGB 17.0 08/25/2023   HCT 51.5 08/25/2023   MCV 89.9 08/25/2023   PLT 325 08/25/2023   Lab Results  Component Value Date   HGBA1C 5.9 (A) 01/22/2024   Lab Results  Component Value Date   CHOL 171 10/06/2023   HDL 32 (L) 10/06/2023   LDLCALC 115 (H) 10/06/2023   TRIG 135 10/06/2023   No results found for: TSH  Lab Results  Component Value Date   HGBA1C 5.9 (A) 01/22/2024   HGBA1C 6.4 10/30/2023   HGBA1C 7.1 (H) 01/06/2023    Assessment and Plan:  Type 2 diabetes mellitus with hyperglycemia, without long-term current use of insulin (HCC) Assessment & Plan: A1c 5.9% today reflecting continued improvement with Mounjaro, continue this therapy.  Encouraged to make follow-up with endocrinology.  Orders: -     POCT glycosylated hemoglobin (Hb A1C)  Long-term current use of injectable noninsulin antidiabetic medication Assessment & Plan: Continue Mounjaro   Primary hypertension Assessment & Plan: Well-controlled with current medications, refilling today  Orders: -     amLODIPine  Besy-Benazepril  HCl; Take 1 capsule by mouth daily.  Dispense: 90 capsule; Refill: 2  Hyperlipidemia associated with type 2 diabetes mellitus (HCC) Assessment & Plan: Continue atorvastatin , will check fasting lipids next time and might consider dose adjustment pending those labs.  Orders: -     Atorvastatin  Calcium ; Take 1 tablet (40 mg total) by mouth daily.  Dispense: 90 tablet; Refill: 2  Benign prostatic hyperplasia with incomplete bladder emptying Assessment & Plan: Stable, refill tamsulosin .  Orders: -     Tamsulosin  HCl; Take 1 capsule (0.4 mg total) by mouth daily.  Dispense: 90 capsule; Refill: 2  Tobacco use disorder Assessment & Plan: Patient seems ready.  Referring to smoking cessation program.  Orders: -     Ambulatory referral to  Virtual Care Smoking Cessation  Paraseptal emphysema (HCC) Assessment & Plan: Discussed natural progression of this condition, complicated by ongoing smoking.  He is ready to quit.      Return in about 6 months (around 07/23/2024) for CPE.    Rolan Hoyle, PA-C, DMSc, Nutritionist [facility] [phone]

## 2024-04-05 ENCOUNTER — Encounter: Payer: Self-pay | Admitting: Physician Assistant

## 2024-04-05 ENCOUNTER — Ambulatory Visit (INDEPENDENT_AMBULATORY_CARE_PROVIDER_SITE_OTHER): Admitting: Physician Assistant

## 2024-04-05 VITALS — BP 130/74 | HR 99 | Temp 98.5°F | Ht 71.0 in | Wt 210.0 lb

## 2024-04-05 DIAGNOSIS — K5730 Diverticulosis of large intestine without perforation or abscess without bleeding: Secondary | ICD-10-CM | POA: Insufficient documentation

## 2024-04-05 DIAGNOSIS — E1165 Type 2 diabetes mellitus with hyperglycemia: Secondary | ICD-10-CM

## 2024-04-05 DIAGNOSIS — I700 Atherosclerosis of aorta: Secondary | ICD-10-CM | POA: Insufficient documentation

## 2024-04-05 DIAGNOSIS — F17200 Nicotine dependence, unspecified, uncomplicated: Secondary | ICD-10-CM | POA: Diagnosis not present

## 2024-04-05 DIAGNOSIS — Z0000 Encounter for general adult medical examination without abnormal findings: Secondary | ICD-10-CM | POA: Diagnosis not present

## 2024-04-05 DIAGNOSIS — R3914 Feeling of incomplete bladder emptying: Secondary | ICD-10-CM

## 2024-04-05 DIAGNOSIS — E785 Hyperlipidemia, unspecified: Secondary | ICD-10-CM

## 2024-04-05 DIAGNOSIS — N401 Enlarged prostate with lower urinary tract symptoms: Secondary | ICD-10-CM

## 2024-04-05 DIAGNOSIS — R7401 Elevation of levels of liver transaminase levels: Secondary | ICD-10-CM | POA: Diagnosis not present

## 2024-04-05 DIAGNOSIS — I7121 Aneurysm of the ascending aorta, without rupture: Secondary | ICD-10-CM | POA: Insufficient documentation

## 2024-04-05 DIAGNOSIS — E1169 Type 2 diabetes mellitus with other specified complication: Secondary | ICD-10-CM

## 2024-04-05 DIAGNOSIS — I2510 Atherosclerotic heart disease of native coronary artery without angina pectoris: Secondary | ICD-10-CM | POA: Insufficient documentation

## 2024-04-05 NOTE — Progress Notes (Signed)
 Date:  04/05/2024   Name:  Cory Thompson   DOB:  17-Aug-1960   MRN:  [id number]   Chief Complaint: Annual Exam (SOB )  HPI Cory Thompson is a very pleasant 63 y.o. male with HTN, HLD, DM2, BPH, and tobacco use disorder who recently transferred to my care within the last 3 months.  He is here today for a routine physical exam, and brings a form for me to complete for insurance premium discount.  Last Physical: 04/04/2022 Last Eye Exam: <1y ago Last CRC screen: 03/06/2023 -1 polyp, diverticulosis, nonbleeding hemorrhoids, F/U 7 years Last LDCT: 10/06/2023 with age advanced three-vessel coronary artery calcification, aortic atherosclerosis, 4.1 cm ascending aortic aneurysm (stable), possible pulmonary arterial hypertension, emphysema. Last PSA: 2.8 on 10/06/2023 Immunizations Due: Prevnar 20   Last visit with urology 12/04/2022 who he sees for BPH, stable on tamsulosin  0.4 mg.  Last PSA 2.8 on 10/06/2023, stable.   Sees endocrinology for management of his DM2, last visit with them 10/30/23 when he was titrated up on Mounjaro, currently at the 5 mg/week dose and tolerating well.  Last A1c was 5.9% about 3 months ago.  Patient acknowledges the need for smoking cessation.  Says he quit for about a year roughly 10 years ago, and is confident he can do it again. Has emphysema.  Last visit he was referred for our smoking cessation program, but it does not seem like he was able to coordinate an appointment with them.  He has the number to contact them and schedule when ready.   Medication list has been reviewed and updated.  Current Meds  Medication Sig   albuterol  (VENTOLIN  HFA) 108 (90 Base) MCG/ACT inhaler Inhale 2 puffs into the lungs every 6 (six) hours as needed for wheezing or shortness of breath.   amLODipine -benazepril  (LOTREL) 10-40 MG capsule Take 1 capsule by mouth daily.   atorvastatin  (LIPITOR) 40 MG tablet Take 1 tablet (40 mg total) by mouth daily.   glucose blood test strip Check your sugar  in the morning before you eat breakfast, and one hour after a meal.   glucose monitoring kit (FREESTYLE) monitoring kit 1 each by Does not apply route daily. Check glucose once in the morning before breakfast and 1 hour after a meal   ipratropium (ATROVENT ) 0.06 % nasal spray Place 2 sprays into both nostrils 4 (four) times daily.   MOUNJARO 5 MG/0.5ML Pen    Probiotic Product (PROBIOTIC PO) Take by mouth 3 (three) times a week.   Spacer/Aero-Holding Chambers (AEROCHAMBER MV) inhaler Use as instructed   tamsulosin  (FLOMAX ) 0.4 MG CAPS capsule Take 1 capsule (0.4 mg total) by mouth daily.     Review of Systems  Patient Active Problem List   Diagnosis Date Noted   Diverticulosis of colon 04/05/2024   Aortic atherosclerosis (HCC) 04/05/2024   Coronary atherosclerosis 04/05/2024   Ascending aortic aneurysm (HCC) 04/05/2024   Tobacco use disorder 01/22/2024   Hyperlipidemia associated with type 2 diabetes mellitus (HCC) 01/22/2024   Long-term current use of injectable noninsulin antidiabetic medication 01/22/2024   Polyp of transverse colon 03/06/2023   Paraseptal emphysema (HCC) 10/17/2022   Type 2 diabetes mellitus with hyperglycemia, without long-term current use of insulin (HCC) 12/07/2020   Primary hypertension 05/14/2018   History of stomach ulcers 05/14/2018   Benign prostatic hyperplasia with incomplete bladder emptying 11/29/2017   Sleep apnea 07/25/2017    No Known Allergies  Immunization History  Administered Date(s) Administered   Influenza,inj,Quad PF,6+ Mos 05/14/2018,  05/14/2018, 04/22/2019, 04/04/2022   Influenza-Unspecified 05/14/2018, 04/22/2019   PFIZER(Purple Top)SARS-COV-2 Vaccination 11/16/2019, 12/07/2019, 08/02/2020   Pneumococcal Polysaccharide-23 05/25/2018   Tdap 05/25/2018   Zoster Recombinant(Shingrix) 10/16/2022, 01/01/2023    Past Surgical History:  Procedure Laterality Date   COLONOSCOPY WITH PROPOFOL  N/A 03/06/2023   Procedure: COLONOSCOPY WITH  BIOPSY;  Surgeon: Jinny Carmine, MD;  Location: [facility];  Service: Endoscopy;  Laterality: N/A;   POLYPECTOMY N/A 03/06/2023   Procedure: POLYPECTOMY;  Surgeon: Jinny Carmine, MD;  Location: [facility];  Service: Endoscopy;  Laterality: N/A;   TOOTH EXTRACTION      Social History   Tobacco Use   Smoking status: Every Day    Current packs/day: 0.50    Average packs/day: 0.6 packs/day for 61.7 years (38.6 ttl pk-yrs)    Types: Cigarettes    Start date: 1995   Smokeless tobacco: Never  Vaping Use   Vaping status: Never Used  Substance Use Topics   Alcohol use: Yes    Comment: socially, every couple of weeks   Drug use: Never    Family History  Problem Relation Age of Onset   Diabetes Mother    Hypertension Mother    Hypertension Father         04/05/2024    3:43 PM 01/22/2024    8:12 AM 09/18/2023    8:25 AM 10/17/2022    9:52 AM  GAD 7 : Generalized Anxiety Score  Nervous, Anxious, on Edge 0 0 0 0  Control/stop worrying 0 1 0 0  Worry too much - different things 0 1 0 0  Trouble relaxing 0 1 1 0  Restless 0 1 1 0  Easily annoyed or irritable 0 0 0 0  Afraid - awful might happen 0 0 0 0  Total GAD 7 Score 0 4 2 0  Anxiety Difficulty Not difficult at all Not difficult at all Not difficult at all Not difficult at all       04/05/2024    3:43 PM 01/22/2024    8:12 AM 09/18/2023    8:25 AM  Depression screen PHQ 2/9  Decreased Interest 0 1 0  Down, Depressed, Hopeless 0 0 0  PHQ - 2 Score 0 1 0  Altered sleeping   1  Tired, decreased energy   1  Change in appetite   0  Feeling bad or failure about yourself    0  Trouble concentrating   0  Moving slowly or fidgety/restless   0  Suicidal thoughts   0  PHQ-9 Score   2  Difficult doing work/chores   Not difficult at all    BP Readings from Last 3 Encounters:  04/05/24 130/74  01/22/24 118/74  12/04/23 125/78    Wt Readings from Last 3 Encounters:  04/05/24 210 lb (95.3 kg)  01/22/24 213 lb  (96.6 kg)  12/04/23 209 lb (94.8 kg)    BP 130/74   Pulse 99   Temp 98.5 F (36.9 C)   Ht 5' 11 (1.803 m)   Wt 210 lb (95.3 kg)   SpO2 91%   BMI 29.29 kg/m   Physical Exam Vitals and nursing note reviewed.  Constitutional:      Appearance: Normal appearance.  HENT:     Ears:     Comments: EAC clear bilaterally with good view of TM which is without effusion or erythema.     Nose: Nose normal.     Mouth/Throat:     Mouth: Mucous  membranes are moist. No oral lesions.     Dentition: Normal dentition.     Pharynx: No posterior oropharyngeal erythema.  Eyes:     Extraocular Movements: Extraocular movements intact.     Conjunctiva/sclera: Conjunctivae normal.     Pupils: Pupils are equal, round, and reactive to light.  Neck:     Thyroid: No thyromegaly.  Cardiovascular:     Rate and Rhythm: Normal rate and regular rhythm.     Heart sounds: No murmur heard.    No friction rub. No gallop.     Comments: Pulses 2+ at radial, PT, DP bilaterally. No carotid bruit. No peripheral edema Pulmonary:     Effort: Pulmonary effort is normal.     Breath sounds: Normal breath sounds.  Abdominal:     General: Bowel sounds are normal.     Palpations: Abdomen is soft. There is no mass.     Tenderness: There is no abdominal tenderness.  Genitourinary:    Penis: Normal and uncircumcised.      Testes:        Right: Varicocele present. Mass not present.        Left: Varicocele present. Mass not present.     Prostate: Enlarged. Not tender and no nodules present.     Rectum: Guaiac result negative. No mass.  Musculoskeletal:     Comments: Full ROM with strength 5/5 bilateral upper and lower extremities  Lymphadenopathy:     Cervical: No cervical adenopathy.  Skin:    General: Skin is warm.     Capillary Refill: Capillary refill takes less than 2 seconds.     Findings: No lesion or rash.  Neurological:     Mental Status: He is alert and oriented to person, place, and time.     Gait: Gait  is intact.  Psychiatric:        Mood and Affect: Mood normal.        Behavior: Behavior normal.     Diabetic Foot Exam - Simple   Simple Foot Form Diabetic Foot exam was performed with the following findings: Yes 04/05/2024  4:14 PM  Visual Inspection No deformities, no ulcerations, no other skin breakdown bilaterally: Yes Sensation Testing Intact to touch and monofilament testing bilaterally: Yes Pulse Check Posterior Tibialis and Dorsalis pulse intact bilaterally: Yes Comments     Recent Labs     Component Value Date/Time   NA 137 08/25/2023 2247   NA 138 01/06/2023 1402   K 3.8 08/25/2023 2247   CL 106 08/25/2023 2247   CO2 22 08/25/2023 2247   GLUCOSE 102 (H) 08/25/2023 2247   BUN 13 08/25/2023 2247   BUN 12 01/06/2023 1402   CREATININE 0.95 08/25/2023 2247   CALCIUM  9.6 08/25/2023 2247   PROT 7.8 08/25/2023 2247   PROT 7.3 01/06/2023 1402   ALBUMIN 4.2 08/25/2023 2247   ALBUMIN 4.5 01/06/2023 1402   AST 38 08/25/2023 2247   ALT 18 08/25/2023 2247   ALKPHOS 93 08/25/2023 2247   BILITOT 0.8 08/25/2023 2247   BILITOT 0.4 01/06/2023 1402   GFRNONAA >60 08/25/2023 2247    Lab Results  Component Value Date   WBC 8.6 08/25/2023   HGB 17.0 08/25/2023   HCT 51.5 08/25/2023   MCV 89.9 08/25/2023   PLT 325 08/25/2023   Lab Results  Component Value Date   HGBA1C 5.9 (A) 01/22/2024   HGBA1C 6.4 10/30/2023   HGBA1C 7.1 (H) 01/06/2023   Lab Results  Component Value Date  CHOL 171 10/06/2023   HDL 32 (L) 10/06/2023   LDLCALC 115 (H) 10/06/2023   TRIG 135 10/06/2023   No results found for: TSH    Assessment and Plan:  1. Annual physical exam (Primary) Encouraged healthy lifestyle including regular physical activity and consumption of whole fruits and vegetables. Encouraged routine dental and eye exams.   - CBC with Differential/Platelet - Comprehensive metabolic panel with GFR - Hemoglobin A1c - PSA - Lipid panel - Microalbumin / creatinine urine  ratio  2. Type 2 diabetes mellitus with hyperglycemia, without long-term current use of insulin (HCC) - CBC with Differential/Platelet - Comprehensive metabolic panel with GFR - Hemoglobin A1c - Lipid panel - Microalbumin / creatinine urine ratio  3. Tobacco use disorder Encouraged to schedule consult with tobacco cessation program  4. Benign prostatic hyperplasia with incomplete bladder emptying Will check PSA since we are checking other blood work today as well - PSA  5. Hyperlipidemia associated with type 2 diabetes mellitus (HCC) Check nonfasting lipids - Lipid panel   Return in 6 months for OV follow-up chronic conditions Return in about 1 year (around 04/05/2025) for fasting CPE.    Rolan Hoyle, PA-C, DMSc, Nutritionist [facility] [phone]

## 2024-04-06 LAB — COMPREHENSIVE METABOLIC PANEL WITH GFR
ALT: 18 IU/L (ref 0–44)
AST: 46 IU/L — ABNORMAL HIGH (ref 0–40)
Albumin: 4.7 g/dL (ref 3.9–4.9)
Alkaline Phosphatase: 168 IU/L — ABNORMAL HIGH (ref 44–121)
BUN/Creatinine Ratio: 12 (ref 10–24)
BUN: 14 mg/dL (ref 8–27)
Bilirubin Total: 0.4 mg/dL (ref 0.0–1.2)
CO2: 22 mmol/L (ref 20–29)
Calcium: 10 mg/dL (ref 8.6–10.2)
Chloride: 103 mmol/L (ref 96–106)
Creatinine, Ser: 1.19 mg/dL (ref 0.76–1.27)
Globulin, Total: 2.9 g/dL (ref 1.5–4.5)
Glucose: 91 mg/dL (ref 70–99)
Potassium: 4.6 mmol/L (ref 3.5–5.2)
Sodium: 140 mmol/L (ref 134–144)
Total Protein: 7.6 g/dL (ref 6.0–8.5)
eGFR: 69 mL/min/1.73 (ref >59)

## 2024-04-06 LAB — CBC WITH DIFFERENTIAL/PLATELET
Basophils Absolute: 0.1 x10E3/uL (ref 0.0–0.2)
Basos: 1 %
EOS (ABSOLUTE): 0.4 x10E3/uL (ref 0.0–0.4)
Eos: 5 %
Hematocrit: 49.9 % (ref 37.5–51.0)
Hemoglobin: 16.8 g/dL (ref 13.0–17.7)
Immature Grans (Abs): 0 x10E3/uL (ref 0.0–0.1)
Immature Granulocytes: 0 %
Lymphocytes Absolute: 3.1 x10E3/uL (ref 0.7–3.1)
Lymphs: 36 %
MCH: 30.8 pg (ref 26.6–33.0)
MCHC: 33.7 g/dL (ref 31.5–35.7)
MCV: 91 fL (ref 79–97)
Monocytes Absolute: 0.6 x10E3/uL (ref 0.1–0.9)
Monocytes: 7 %
Neutrophils Absolute: 4.2 x10E3/uL (ref 1.4–7.0)
Neutrophils: 51 %
Platelets: 328 x10E3/uL (ref 150–450)
RBC: 5.46 x10E6/uL (ref 4.14–5.80)
RDW: 12.7 % (ref 11.6–15.4)
WBC: 8.4 x10E3/uL (ref 3.4–10.8)

## 2024-04-06 LAB — HEMOGLOBIN A1C
Est. average glucose Bld gHb Est-mCnc: 128 mg/dL
Hgb A1c MFr Bld: 6.1 % — ABNORMAL HIGH (ref 4.8–5.6)

## 2024-04-06 LAB — LIPID PANEL
Chol/HDL Ratio: 3.9 ratio (ref 0.0–5.0)
Cholesterol, Total: 129 mg/dL (ref 100–199)
HDL: 33 mg/dL — ABNORMAL LOW (ref >39)
LDL Chol Calc (NIH): 77 mg/dL (ref 0–99)
Triglycerides: 100 mg/dL (ref 0–149)
VLDL Cholesterol Cal: 19 mg/dL (ref 5–40)

## 2024-04-06 LAB — MICROALBUMIN / CREATININE URINE RATIO
Creatinine, Urine: 97.1 mg/dL
Microalb/Creat Ratio: 37 mg/g{creat} — ABNORMAL HIGH (ref 0–29)
Microalbumin, Urine: 35.8 ug/mL

## 2024-04-06 LAB — PSA: Prostate Specific Ag, Serum: 2.5 ng/mL (ref 0.0–4.0)

## 2024-04-08 ENCOUNTER — Ambulatory Visit: Payer: Self-pay | Admitting: Physician Assistant

## 2024-04-08 ENCOUNTER — Encounter: Payer: Self-pay | Admitting: Physician Assistant

## 2024-04-08 DIAGNOSIS — I861 Scrotal varices: Secondary | ICD-10-CM | POA: Insufficient documentation

## 2024-04-08 DIAGNOSIS — E1129 Type 2 diabetes mellitus with other diabetic kidney complication: Secondary | ICD-10-CM | POA: Insufficient documentation

## 2024-04-10 LAB — VIRAL HEPATITIS HBV, HCV
HCV Ab: NONREACTIVE
Hep B Core Total Ab: POSITIVE — AB
Hep B Surface Ab, Qual: REACTIVE
Hepatitis B Surface Ag: NEGATIVE

## 2024-04-10 LAB — SPECIMEN STATUS REPORT

## 2024-04-10 LAB — HBCIGM: Hep B C IgM: NEGATIVE

## 2024-04-10 LAB — HCV INTERPRETATION

## 2024-04-11 ENCOUNTER — Telehealth: Payer: Self-pay

## 2024-04-11 NOTE — Telephone Encounter (Signed)
 Noted  KP

## 2024-04-11 NOTE — Telephone Encounter (Signed)
 Copied from CRM [id number]. Topic: Clinical - Lab/Test Results >> Apr 11, 2024  8:58 AM Willma R wrote: Reason for CRM: Relayed lab results. No additional questions

## 2024-04-12 ENCOUNTER — Encounter: Admitting: Physician Assistant

## 2024-08-12 ENCOUNTER — Ambulatory Visit: Admitting: Family Medicine

## 2024-08-19 ENCOUNTER — Ambulatory Visit

## 2024-08-19 ENCOUNTER — Ambulatory Visit: Admitting: Family Medicine

## 2024-09-02 ENCOUNTER — Ambulatory Visit: Admitting: Family Medicine

## 2024-10-07 ENCOUNTER — Ambulatory Visit: Admitting: Physician Assistant

## 2024-12-04 ENCOUNTER — Ambulatory Visit: Admitting: Urology

## 2025-01-27 ENCOUNTER — Encounter: Admitting: Physician Assistant

## 2025-04-11 ENCOUNTER — Encounter: Admitting: Physician Assistant
